# Patient Record
Sex: Female | Born: 1985 | Race: Asian | Hispanic: No | Marital: Married | Smoking: Never smoker
Health system: Southern US, Community
[De-identification: ages and names within clinical notes are randomized; demographics above are authoritative.]

## PROBLEM LIST (undated history)

## (undated) ENCOUNTER — Inpatient Hospital Stay (HOSPITAL_COMMUNITY): Payer: Self-pay

## (undated) DIAGNOSIS — Z789 Other specified health status: Secondary | ICD-10-CM

## (undated) DIAGNOSIS — J45909 Unspecified asthma, uncomplicated: Secondary | ICD-10-CM

## (undated) DIAGNOSIS — R0600 Dyspnea, unspecified: Secondary | ICD-10-CM

## (undated) HISTORY — PX: NO PAST SURGERIES: SHX2092

## (undated) HISTORY — DX: Dyspnea, unspecified: R06.00

---

## 2014-10-19 ENCOUNTER — Emergency Department (HOSPITAL_BASED_OUTPATIENT_CLINIC_OR_DEPARTMENT_OTHER)
Admission: EM | Admit: 2014-10-19 | Discharge: 2014-10-20 | Disposition: A | Discharge status: Home / Self Care | Payer: Medicaid Other | Attending: Emergency Medicine | Admitting: Emergency Medicine

## 2014-10-19 ENCOUNTER — Encounter (HOSPITAL_BASED_OUTPATIENT_CLINIC_OR_DEPARTMENT_OTHER): Payer: Self-pay | Admitting: Emergency Medicine

## 2014-10-19 DIAGNOSIS — T7840XA Allergy, unspecified, initial encounter: Secondary | ICD-10-CM | POA: Diagnosis present

## 2014-10-19 DIAGNOSIS — X58XXXA Exposure to other specified factors, initial encounter: Secondary | ICD-10-CM | POA: Insufficient documentation

## 2014-10-19 DIAGNOSIS — Y9289 Other specified places as the place of occurrence of the external cause: Secondary | ICD-10-CM | POA: Diagnosis not present

## 2014-10-19 DIAGNOSIS — Y9389 Activity, other specified: Secondary | ICD-10-CM | POA: Insufficient documentation

## 2014-10-19 DIAGNOSIS — Y998 Other external cause status: Secondary | ICD-10-CM | POA: Insufficient documentation

## 2014-10-19 MED ORDER — EPINEPHRINE 0.3 MG/0.3ML IJ SOAJ
0.3000 mg | Freq: Once | INTRAMUSCULAR | Status: AC
  Administered 2014-10-19: 0.3 mg via INTRAMUSCULAR
  Filled 2014-10-19: qty 0.3

## 2014-10-19 MED ORDER — DEXAMETHASONE SODIUM PHOSPHATE 10 MG/ML IJ SOLN
10.0000 mg | Freq: Once | INTRAMUSCULAR | Status: AC
  Administered 2014-10-19: 10 mg via INTRAVENOUS
  Filled 2014-10-19: qty 1

## 2014-10-19 MED ORDER — FAMOTIDINE IN NACL 20-0.9 MG/50ML-% IV SOLN
20.0000 mg | Freq: Once | INTRAVENOUS | Status: AC
Start: 2014-10-19 — End: 2014-10-19
  Administered 2014-10-19: 20 mg via INTRAVENOUS
  Filled 2014-10-19: qty 50

## 2014-10-19 NOTE — ED Provider Notes (Signed)
CSN: [id number]     Arrival date & time 10/19/14  2231 History   None    Chief Complaint  Patient presents with  . Allergic Reaction     (Consider location/radiation/quality/duration/timing/severity/associated sxs/prior Treatment) HPI  This is a 29 year old female who fell into some bushes about 2 hours prior to arrival; she thinks she was bitten or stung by an insect. She subsequently developed shortness of breath, generalized urticarial rash, itching and swelling of her eyes. She vomited one time. Symptoms are moderate to severe. She was given 50 milligrams of Benadryl by her husband about 30 minutes prior to arrival. He also put some Benadryl cream on her face. She is having some painful swelling of her right foot. She is also having a sore throat, worse with swallowing.  History reviewed. No pertinent past medical history. History reviewed. No pertinent past surgical history. No family history on file. Social History  Substance Use Topics  . Smoking status: Never Smoker   . Smokeless tobacco: None  . Alcohol Use: None   OB History    No data available     Review of Systems  All other systems reviewed and are negative.   Allergies  Review of patient's allergies indicates no known allergies.  Home Medications   Prior to Admission medications   Not on File   BP 102/58 mmHg  Pulse 80  Temp(Src) 98.8 F (37.1 C) (Oral)  Resp 22  Ht  (1.626 m)  Wt 120 lb (54.432 kg)  BMI 20.59 kg/m2  SpO2 97%   Physical Exam  General: Well-developed, well-nourished female in mild distress; appearance consistent with age of record HENT: normocephalic; atraumatic; angioedema of eyelids Eyes: pupils equal, round and reactive to light; extraocular muscles intact Neck: supple Heart: regular rate and rhythm Lungs: Tachypnea; decreased air movement bilaterally without wheezing Abdomen: soft; nondistended; nontender; bowel sounds present Extremities: No deformity; full range of  motion; pulses normal Neurologic: Awake, alert; motor function intact in all extremities and symmetric; no facial droop Skin: Warm and dry; generalized urticarial rash most prominent on the face and neck; erythema and swelling of the right foot Psychiatric: Anxious; tearful      ED Course  Procedures (including critical care time)   MDM  12:15 AM Patient significantly improved after medications. She is calm, breathing easily and the rash is receding. She still complaining of a sore throat. We will check for strep as she has been exposed to a family member with a sore throat.  Nursing notes and vitals signs, including pulse oximetry, reviewed.  Summary of this visit's results, reviewed by myself:  Labs:  Results for orders placed or performed during the hospital encounter of 10/19/14 (from the past 24 hour(s))  Rapid strep screen     Status: None   Collection Time: 10/20/14 12:20 AM  Result Value Ref Range   Streptococcus, Group A Screen (Direct) NEGATIVE NEGATIVE   1:32 AM Rash resolved, patient's only complaint is persistent sore throat. We will discharge her on Benadryl and provided a prescription for an epinephrine autoinjector.   Paula Libra, MD 10/20/14 [phone]

## 2014-10-19 NOTE — ED Notes (Signed)
Patient placed on cardiac monitor set for every 30 vitals

## 2014-10-19 NOTE — ED Notes (Signed)
Patient states that she fell into a bed of poision ivy and has a red raised rash throughout her face and eyes. Her eyes are swollen shut and having trouble breathing.

## 2014-10-20 LAB — RAPID STREP SCREEN (MED CTR MEBANE ONLY): STREPTOCOCCUS, GROUP A SCREEN (DIRECT): NEGATIVE

## 2014-10-20 MED ORDER — DIPHENHYDRAMINE HCL 25 MG PO TABS: ORAL_TABLET | ORAL | Status: DC

## 2014-10-20 MED ORDER — DIPHENHYDRAMINE HCL 25 MG PO CAPS
25.0000 mg | ORAL_CAPSULE | Freq: Once | ORAL | Status: AC
  Administered 2014-10-20: 25 mg via ORAL

## 2014-10-20 MED ORDER — DIPHENHYDRAMINE HCL 50 MG/ML IJ SOLN
25.0000 mg | Freq: Once | INTRAMUSCULAR | Status: DC
Start: 2014-10-20 — End: 2014-10-20

## 2014-10-20 MED ORDER — DIPHENHYDRAMINE HCL 25 MG PO CAPS
ORAL_CAPSULE | ORAL | Status: AC
  Administered 2014-10-20: 25 mg via ORAL
  Filled 2014-10-20: qty 1

## 2014-10-20 MED ORDER — EPINEPHRINE 0.3 MG/0.3ML IJ SOAJ: INTRAMUSCULAR | Status: DC

## 2014-10-20 NOTE — ED Notes (Signed)
Pt condition has much improved facial edema and generalized rash nearly resolved and pt denies SOB breathing easy. Family at bedside to support pt at discahrge

## 2014-10-22 LAB — CULTURE, GROUP A STREP: STREP A CULTURE: NEGATIVE

## 2014-11-10 ENCOUNTER — Encounter (HOSPITAL_BASED_OUTPATIENT_CLINIC_OR_DEPARTMENT_OTHER): Payer: Self-pay

## 2014-11-10 ENCOUNTER — Emergency Department (HOSPITAL_BASED_OUTPATIENT_CLINIC_OR_DEPARTMENT_OTHER): Payer: Medicaid Other

## 2014-11-10 ENCOUNTER — Emergency Department (HOSPITAL_BASED_OUTPATIENT_CLINIC_OR_DEPARTMENT_OTHER)
Admission: EM | Admit: 2014-11-10 | Discharge: 2014-11-10 | Disposition: A | Discharge status: Home / Self Care | Payer: Medicaid Other | Attending: Emergency Medicine | Admitting: Emergency Medicine

## 2014-11-10 DIAGNOSIS — R109 Unspecified abdominal pain: Secondary | ICD-10-CM

## 2014-11-10 DIAGNOSIS — N3000 Acute cystitis without hematuria: Secondary | ICD-10-CM

## 2014-11-10 DIAGNOSIS — O2311 Infections of bladder in pregnancy, first trimester: Secondary | ICD-10-CM | POA: Insufficient documentation

## 2014-11-10 DIAGNOSIS — R5383 Other fatigue: Secondary | ICD-10-CM | POA: Diagnosis not present

## 2014-11-10 DIAGNOSIS — O26899 Other specified pregnancy related conditions, unspecified trimester: Secondary | ICD-10-CM

## 2014-11-10 DIAGNOSIS — K5900 Constipation, unspecified: Secondary | ICD-10-CM | POA: Insufficient documentation

## 2014-11-10 DIAGNOSIS — O99611 Diseases of the digestive system complicating pregnancy, first trimester: Secondary | ICD-10-CM | POA: Diagnosis not present

## 2014-11-10 DIAGNOSIS — Z3490 Encounter for supervision of normal pregnancy, unspecified, unspecified trimester: Secondary | ICD-10-CM

## 2014-11-10 DIAGNOSIS — Z3A01 Less than 8 weeks gestation of pregnancy: Secondary | ICD-10-CM | POA: Diagnosis not present

## 2014-11-10 DIAGNOSIS — O9989 Other specified diseases and conditions complicating pregnancy, childbirth and the puerperium: Secondary | ICD-10-CM | POA: Diagnosis present

## 2014-11-10 LAB — CBC WITH DIFFERENTIAL/PLATELET
BASOS PCT: 0 %
Basophils Absolute: 0 10*3/uL (ref 0.0–0.1)
EOS ABS: 0.1 10*3/uL (ref 0.0–0.7)
EOS PCT: 1 %
HCT: 37 % (ref 36.0–46.0)
HEMOGLOBIN: 13 g/dL (ref 12.0–15.0)
Lymphocytes Relative: 29 %
Lymphs Abs: 1.7 10*3/uL (ref 0.7–4.0)
MCH: 28.5 pg (ref 26.0–34.0)
MCHC: 35.1 g/dL (ref 30.0–36.0)
MCV: 81.1 fL (ref 78.0–100.0)
MONOS PCT: 8 %
Monocytes Absolute: 0.5 10*3/uL (ref 0.1–1.0)
NEUTROS PCT: 62 %
Neutro Abs: 3.7 10*3/uL (ref 1.7–7.7)
PLATELETS: 214 10*3/uL (ref 150–400)
RBC: 4.56 MIL/uL (ref 3.87–5.11)
RDW: 12.9 % (ref 11.5–15.5)
WBC: 5.9 10*3/uL (ref 4.0–10.5)

## 2014-11-10 LAB — URINALYSIS, ROUTINE W REFLEX MICROSCOPIC
Glucose, UA: NEGATIVE mg/dL
KETONES UR: 15 mg/dL — AB
NITRITE: NEGATIVE
PH: 6 (ref 5.0–8.0)
PROTEIN: NEGATIVE mg/dL
Specific Gravity, Urine: 1.03 (ref 1.005–1.030)
UROBILINOGEN UA: 1 mg/dL (ref 0.0–1.0)

## 2014-11-10 LAB — HCG, QUANTITATIVE, PREGNANCY: HCG, BETA CHAIN, QUANT, S: 135529 m[IU]/mL — AB (ref <5)

## 2014-11-10 LAB — BASIC METABOLIC PANEL
Anion gap: 3 — ABNORMAL LOW (ref 5–15)
BUN: 12 mg/dL (ref 6–20)
CHLORIDE: 107 mmol/L (ref 101–111)
CO2: 24 mmol/L (ref 22–32)
CREATININE: 0.58 mg/dL (ref 0.44–1.00)
Calcium: 8.7 mg/dL — ABNORMAL LOW (ref 8.9–10.3)
Glucose, Bld: 101 mg/dL — ABNORMAL HIGH (ref 65–99)
Potassium: 4.1 mmol/L (ref 3.5–5.1)
SODIUM: 134 mmol/L — AB (ref 135–145)

## 2014-11-10 LAB — URINE MICROSCOPIC-ADD ON

## 2014-11-10 LAB — PREGNANCY, URINE: Preg Test, Ur: POSITIVE — AB

## 2014-11-10 MED ORDER — PRENATAL COMPLETE 14-0.4 MG PO TABS: 1.0000 | ORAL_TABLET | Freq: Every day | ORAL | Status: DC

## 2014-11-10 MED ORDER — CEPHALEXIN 500 MG PO CAPS: 500.0000 mg | ORAL_CAPSULE | Freq: Four times a day (QID) | ORAL | Status: DC

## 2014-11-10 NOTE — ED Notes (Signed)
abd pain x 1 week-back pain x 2 days per husband-pt does not speak English-[place]rived [place] 2 [place]

## 2014-11-10 NOTE — ED Provider Notes (Signed)
CSN: [id number]     Arrival date & time 11/10/14  1355 History   First MD Initiated Contact with Patient 11/10/14 1409     Chief Complaint  Patient presents with  . Abdominal Pain    (Consider location/radiation/quality/duration/timing/severity/associated sxs/prior Treatment) HPI Comments: Patient with no significant past medical history presents with complaint of lower abdominal pain for the past 1-2 weeks, lower back pain over the past 2 days. She has also had fatigue. Associated with constipation but no diarrhea. No blood in stool. No urinary frequency, urgency. She normally has her menstrual period twice a month. Her last menstrual period was 1-1/2 months ago. Husband feels that she could be pregnant. Patient has had nausea but no vomiting. Patient has had chest pain and some associated shortness of breath, mild, intermittently for a very long time (months to years) and this is unchanged. No treatments prior to arrival.  The history is provided by the patient and the spouse. The history is limited by a language barrier. A language interpreter was used (husband).    History reviewed. No pertinent past medical history. History reviewed. No pertinent past surgical history. No family history on file. Social History  Substance Use Topics  . Smoking status: Never Smoker   . Smokeless tobacco: None  . Alcohol Use: No   OB History    No data available     Review of Systems  Constitutional: Positive for fatigue. Negative for fever.  HENT: Negative for rhinorrhea and sore throat.   Eyes: Negative for redness.  Respiratory: Negative for cough.   Cardiovascular: Negative for chest pain.  Gastrointestinal: Positive for nausea and abdominal pain. Negative for vomiting and diarrhea.  Genitourinary: Negative for dysuria, urgency, frequency, vaginal bleeding and vaginal discharge.  Musculoskeletal: Positive for back pain. Negative for myalgias.  Skin: Negative for rash.  Neurological:  Negative for headaches.      Allergies  Review of patient's allergies indicates no known allergies.  Home Medications   Prior to Admission medications   Medication Sig Start Date End Date Taking? Authorizing Provider  diphenhydrAMINE (BENADRYL) 25 MG tablet Take 2 tablets every 6 hours for the next 3 days then every 6 hours as needed for allergic reaction. 10/20/14   John Molpus, MD  EPINEPHrine 0.3 mg/0.3 mL IJ SOAJ injection Self inject per package instructions as needed for allergic reaction and seek medical care.  Pharmacist: please dispense authorized generic 10/20/14   Paula LibraJohn Molpus, MD   BP 107/65 mmHg  Pulse 78  Temp(Src) 98.7 F (37.1 C) (Oral)  Resp 16  Ht 5\' 3"  (1.6 m)  Wt 127 lb (57.607 kg)  BMI 22.50 kg/m2  SpO2 98% Physical Exam  Constitutional: She appears well-developed and well-nourished.  HENT:  Head: Normocephalic and atraumatic.  Mouth/Throat: Oropharynx is clear and moist.  Eyes: Conjunctivae are normal. Right eye exhibits no discharge. Left eye exhibits no discharge.  Neck: Normal range of motion. Neck supple.  Cardiovascular: Normal rate, regular rhythm and normal heart sounds.   No murmur heard. Pulmonary/Chest: Effort normal and breath sounds normal. No respiratory distress. She has no wheezes. She has no rales.  Abdominal: Soft. Bowel sounds are normal. She exhibits no distension. There is tenderness (mild, lower abdomen). There is no rebound and no guarding.  Non-gravid  Musculoskeletal: Normal range of motion. She exhibits no edema or tenderness.  Neurological: She is alert.  Skin: Skin is warm and dry.  Psychiatric: She has a normal mood and affect.  Nursing note and  vitals reviewed.   ED Course  Procedures (including critical care time) Labs Review Labs Reviewed  URINALYSIS, ROUTINE W REFLEX MICROSCOPIC (NOT AT [facility]) - Abnormal; Notable for the following:    Color, Urine AMBER (*)    APPearance CLOUDY (*)    Hgb urine dipstick SMALL (*)     Bilirubin Urine SMALL (*)    Ketones, ur 15 (*)    Leukocytes, UA MODERATE (*)    All other components within normal limits  PREGNANCY, URINE - Abnormal; Notable for the following:    Preg Test, Ur POSITIVE (*)    All other components within normal limits  BASIC METABOLIC PANEL - Abnormal; Notable for the following:    Sodium 134 (*)    Glucose, Bld 101 (*)    Calcium 8.7 (*)    Anion gap 3 (*)    All other components within normal limits  HCG, QUANTITATIVE, PREGNANCY - Abnormal; Notable for the following:    hCG, Beta Chain, Quant, S [id number] (*)    All other components within normal limits  URINE MICROSCOPIC-ADD ON - Abnormal; Notable for the following:    Squamous Epithelial / LPF FEW (*)    Bacteria, UA MANY (*)    All other components within normal limits  CBC WITH DIFFERENTIAL/PLATELET    Imaging Review US Ob Comp Less 14 Wks  11/10/2014  CLINICAL DATA:  Two day history of left-sided pelvic pain EXAM: OBSTETRIC <14 WK [place] AND TRANSVAGINAL OB US TECHNIQUE: Both transabdominal and transvaginal ultrasound examinations were performed for complete evaluation of the gestation as well as the maternal uterus, adnexal regions, and pelvic cul-de-sac. Transvaginal technique was performed to assess early pregnancy. COMPARISON:  None. FINDINGS: Intrauterine gestational sac: Visualized/normal in shape. Yolk sac:  Visualized Embryo:  Visualized Cardiac Activity: Visualized Heart Rate: 143  bpm CRL:  10  mm 7  w   1 d                  [place] EDC: June 27, 2015 Maternal uterus/adnexae: There is no demonstrable subchorionic hemorrhage. Cervical os is closed. There are small paraovarian cysts on the left, largest measuring 1.2 x 0.9 cm. No free pelvic fluid. IMPRESSION: Single live intrauterine gestation with estimated gestational age of [redacted] weeks. No subchorionic hemorrhage appreciable. Small maternal left paraovarian cysts. Electronically Signed   By: Bretta Bang III M.D.   On: 11/10/2014 16:28   US  Ob Transvaginal  11/10/2014  CLINICAL DATA:  Two day history of left-sided pelvic pain EXAM: OBSTETRIC <14 WK [place] AND TRANSVAGINAL OB US TECHNIQUE: Both transabdominal and transvaginal ultrasound examinations were performed for complete evaluation of the gestation as well as the maternal uterus, adnexal regions, and pelvic cul-de-sac. Transvaginal technique was performed to assess early pregnancy. COMPARISON:  None. FINDINGS: Intrauterine gestational sac: Visualized/normal in shape. Yolk sac:  Visualized Embryo:  Visualized Cardiac Activity: Visualized Heart Rate: 143  bpm CRL:  10  mm 7  w   1 d                  [place] EDC: June 27, 2015 Maternal uterus/adnexae: There is no demonstrable subchorionic hemorrhage. Cervical os is closed. There are small paraovarian cysts on the left, largest measuring 1.2 x 0.9 cm. No free pelvic fluid. IMPRESSION: Single live intrauterine gestation with estimated gestational age of [redacted] weeks. No subchorionic hemorrhage appreciable. Small maternal left paraovarian cysts. Electronically Signed   By: Bretta Bang III M.D.   On: 11/10/2014  16:28   I have personally reviewed and evaluated these images and lab results as part of my medical decision-making.   EKG Interpretation None       3:01 PM Patient seen and examined. Updated on results including + pregnancy. Will proceed with [place] to r/o tubal pregnancy.   Vital signs reviewed and are as follows: BP 107/65 mmHg  Pulse 78  Temp(Src) 98.7 F (37.1 C) (Oral)  Resp 16  Ht  (1.6 m)  Wt 127 lb (57.607 kg)  BMI 22.50 kg/m2  SpO2 98%  5:39 PM ultrasound completed showing intrauterine pregnancy at approximately 7 weeks 1 day gestation. Patient and husband updated on results including UA positive for probable urinary tract infection. Patient appears well and is comfortable in the room. Referral given to hospital for follow-up. Prescription given for prenatal vitamin. Husband will work on obtaining Medicaid and prenatal  care tomorrow. Encouraged follow-up to the emergency department with fever, worsening abdominal pain, vaginal bleeding, or other concerns. They verbalized understanding and agreed with plan.  MDM   Final diagnoses:  Pregnancy  Acute cystitis without hematuria   Pregnancy: Confirmed IUP. Referral to women's hospital given.  Lower abdominal pain with radiation to the back: Likely related to pregnancy plus likely urinary tract infection (11-20 WBCs, many bacteria). Patient treated with Keflex. She appears well, nontoxic. No fever. Do not suspect pyelonephritis. Abdomen is otherwise soft and nontender and I doubt any other emergent abdominal etiology. With blood cell count is normal.  No dangerous or life-threatening conditions suspected or identified by history, physical exam, and by work-up. No indications for hospitalization identified.     Renne Crigler, PA-C 11/10/14 1741  Doug Sou, MD 11/11/14 [phone]

## 2014-11-10 NOTE — Discharge Instructions (Signed)
Please read and follow all provided instructions.  Your diagnoses today include:  1. Abdominal pain in pregnancy   2. Pregnancy   3. Acute cystitis without hematuria     Tests performed today include:  Blood counts and electrolytes  Blood tests to check kidney function  Urine test to look for infection and pregnancy (in women) - shows pregnancy and urinary tract infection  Ultrasound - shows pregnancy in the uterus at approximate 7 weeks and 1 day gestation  Vital signs. See below for your results today.   Medications prescribed:   Keflex (cephalexin) - antibiotic  You have been prescribed an antibiotic medicine: take the entire course of medicine even if you are feeling better. Stopping early can cause the antibiotic not to work.  Take any prescribed medications only as directed.  Home care instructions:   Follow any educational materials contained in this packet.  Follow-up instructions: Please follow-up with your primary care provider in the next 7 days for further evaluation of your symptoms.    Return instructions:  SEEK IMMEDIATE MEDICAL ATTENTION IF:  The pain does not go away or becomes severe   A temperature above 101F develops   Repeated vomiting occurs (multiple episodes)   The pain becomes localized to portions of the abdomen. The right side could possibly be appendicitis. In an adult, the left lower portion of the abdomen could be colitis or diverticulitis.   Blood is being passed in stools or vomit (bright red or black tarry stools)   You develop chest pain, difficulty breathing, dizziness or fainting, or become confused, poorly responsive, or inconsolable (young children)  If you have any other emergent concerns regarding your health  Additional Information: Abdominal (belly) pain can be caused by many things. Your caregiver performed an examination and possibly ordered blood/urine tests and imaging (CT scan, x-rays, ultrasound). Many cases can be  observed and treated at home after initial evaluation in the emergency department. Even though you are being discharged home, abdominal pain can be unpredictable. Therefore, you need a repeated exam if your pain does not resolve, returns, or worsens. Most patients with abdominal pain don't have to be admitted to the hospital or have surgery, but serious problems like appendicitis and gallbladder attacks can start out as nonspecific pain. Many abdominal conditions cannot be diagnosed in one visit, so follow-up evaluations are very important.  Your vital signs today were: BP 107/65 mmHg   Pulse 78   Temp(Src) 98.7 F (37.1 C) (Oral)   Resp 16   Ht 5\' 3"  (1.6 m)   Wt 127 lb (57.607 kg)   BMI 22.50 kg/m2   SpO2 98% If your blood pressure (bp) was elevated above 135/85 this visit, please have this repeated by your doctor within one month. --------------

## 2014-11-24 ENCOUNTER — Other Ambulatory Visit (HOSPITAL_COMMUNITY)
Admission: RE | Admit: 2014-11-24 | Discharge: 2014-11-24 | Disposition: A | Discharge status: Home / Self Care | Payer: Medicaid Other | Source: Ambulatory Visit | Attending: Student | Admitting: Student

## 2014-11-24 ENCOUNTER — Encounter: Payer: Self-pay | Admitting: Student

## 2014-11-24 ENCOUNTER — Ambulatory Visit (INDEPENDENT_AMBULATORY_CARE_PROVIDER_SITE_OTHER): Payer: Medicaid Other | Admitting: Student

## 2014-11-24 VITALS — BP 116/68 | HR 95 | Temp 98.1°F | Wt 130.0 lb

## 2014-11-24 DIAGNOSIS — Z1151 Encounter for screening for human papillomavirus (HPV): Secondary | ICD-10-CM | POA: Insufficient documentation

## 2014-11-24 DIAGNOSIS — Z23 Encounter for immunization: Secondary | ICD-10-CM | POA: Diagnosis not present

## 2014-11-24 DIAGNOSIS — Z3401 Encounter for supervision of normal first pregnancy, first trimester: Secondary | ICD-10-CM

## 2014-11-24 DIAGNOSIS — Z113 Encounter for screening for infections with a predominantly sexual mode of transmission: Secondary | ICD-10-CM | POA: Diagnosis not present

## 2014-11-24 DIAGNOSIS — O0990 Supervision of high risk pregnancy, unspecified, unspecified trimester: Secondary | ICD-10-CM | POA: Insufficient documentation

## 2014-11-24 DIAGNOSIS — Z124 Encounter for screening for malignant neoplasm of cervix: Secondary | ICD-10-CM | POA: Diagnosis not present

## 2014-11-24 DIAGNOSIS — Z3491 Encounter for supervision of normal pregnancy, unspecified, first trimester: Secondary | ICD-10-CM

## 2014-11-24 LAB — POCT URINALYSIS DIP (DEVICE)
Bilirubin Urine: NEGATIVE
Glucose, UA: NEGATIVE mg/dL
Ketones, ur: NEGATIVE mg/dL
Nitrite: NEGATIVE
PH: 6 (ref 5.0–8.0)
Protein, ur: NEGATIVE mg/dL
SPECIFIC GRAVITY, URINE: 1.025 (ref 1.005–1.030)
UROBILINOGEN UA: 0.2 mg/dL (ref 0.0–1.0)

## 2014-11-24 NOTE — Progress Notes (Signed)
Here for initial visit. Used [employer]. Given prenatal education booklets. C/o pelvic cramping.

## 2014-11-24 NOTE — Patient Instructions (Signed)
First Trimester of Pregnancy The first trimester of pregnancy is from week 1 until the end of week 12 (months 1 through 3). During this time, your baby will begin to develop inside you. At 6-8 weeks, the eyes and face are formed, and the heartbeat can be seen on ultrasound. At the end of 12 weeks, all the baby's organs are formed. Prenatal care is all the medical care you receive before the birth of your baby. Make sure you get good prenatal care and follow all of your doctor's instructions. HOME CARE  Medicines  Take medicine only as told by your doctor. Some medicines are safe and some are not during pregnancy.  Take your prenatal vitamins as told by your doctor.  Take medicine that helps you poop (stool softener) as needed if your doctor says it is okay. Diet  Eat regular, healthy meals.  Your doctor will tell you the amount of weight gain that is right for you.  Avoid raw meat and uncooked cheese.  If you feel sick to your stomach (nauseous) or throw up (vomit):  Eat 4 or 5 small meals a day instead of 3 large meals.  Try eating a few soda crackers.  Drink liquids between meals instead of during meals.  If you have a hard time pooping (constipation):  Eat high-fiber foods like fresh vegetables, fruit, and whole grains.  Drink enough fluids to keep your pee (urine) clear or pale yellow. Activity and Exercise  Exercise only as told by your doctor. Stop exercising if you have cramps or pain in your lower belly (abdomen) or low back.  Try to avoid standing for long periods of time. Move your legs often if you must stand in one place for a long time.  Avoid heavy lifting.  Wear low-heeled shoes. Sit and stand up straight.  You can have sex unless your doctor tells you not to. Relief of Pain or Discomfort  Wear a good support bra if your breasts are sore.  Take warm water baths (sitz baths) to soothe pain or discomfort caused by hemorrhoids. Use hemorrhoid cream if your  doctor says it is okay.  Rest with your legs raised if you have leg cramps or low back pain.  Wear support hose if you have puffy, bulging veins (varicose veins) in your legs. Raise (elevate) your feet for 15 minutes, 3-4 times a day. Limit salt in your diet. Prenatal Care  Schedule your prenatal visits by the twelfth week of pregnancy.  Write down your questions. Take them to your prenatal visits.  Keep all your prenatal visits as told by your doctor. Safety  Wear your seat belt at all times when driving.  Make a list of emergency phone numbers. The list should include numbers for family, friends, the hospital, and police and fire departments. General Tips  Ask your doctor for a referral to a local prenatal class. Begin classes no later than at the start of month 6 of your pregnancy.  Ask for help if you need counseling or help with nutrition. Your doctor can give you advice or tell you where to go for help.  Do not use hot tubs, steam rooms, or saunas.  Do not douche or use tampons or scented sanitary pads.  Do not cross your legs for long periods of time.  Avoid litter boxes and soil used by cats.  Avoid all smoking, herbs, and alcohol. Avoid drugs not approved by your doctor.  Do not use any tobacco products, including cigarettes,  chewing tobacco, and electronic cigarettes. If you need help quitting, ask your doctor. You may get counseling or other support to help you quit. °· Visit your dentist. At home, brush your teeth with a soft toothbrush. Be gentle when you floss. °GET HELP IF: °· You are dizzy. °· You have mild cramps or pressure in your lower belly. °· You have a nagging pain in your belly area. °· You continue to feel sick to your stomach, throw up, or have watery poop (diarrhea). °· You have a bad smelling fluid coming from your vagina. °· You have pain with peeing (urination). °· You have increased puffiness (swelling) in your face, hands, legs, or ankles. °GET HELP  RIGHT AWAY IF:  °· You have a fever. °· You are leaking fluid from your vagina. °· You have spotting or bleeding from your vagina. °· You have very bad belly cramping or pain. °· You gain or lose weight rapidly. °· You throw up blood. It may look like coffee grounds. °· You are around people who have German measles, fifth disease, or chickenpox. °· You have a very bad headache. °· You have shortness of breath. °· You have any kind of trauma, such as from a fall or a car accident. °  °This information is not intended to replace advice given to you by your health care provider. Make sure you discuss any questions you have with your health care provider. °  °Document Released: 06/20/2007 Document Revised: 01/22/2014 Document Reviewed: 11/11/2012 °Elsevier Interactive Patient Education ©2016 [employer] ° ° ° °Safe Medications in Pregnancy  ° °Acne: °Benzoyl Peroxide °Salicylic Acid ° °Backache/Headache: °Tylenol: 2 regular strength every 4 hours OR °             2 Extra strength every 6 hours ° °Colds/Coughs/Allergies: °Benadryl (alcohol free) 25 mg every 6 hours as needed °Breath right strips °Claritin °Cepacol throat lozenges °Chloraseptic throat spray °Cold-Eeze- up to three times per day °Cough drops, alcohol free °Flonase (by prescription only) °Guaifenesin °Mucinex °Robitussin DM (plain only, alcohol free) °Saline nasal spray/drops °Sudafed (pseudoephedrine) & Actifed ** use only after [redacted] weeks gestation and if you do not have high blood pressure °Tylenol °Vicks Vaporub °Zinc lozenges °Zyrtec  ° °Constipation: °Colace °Ducolax suppositories °Fleet enema °Glycerin suppositories °Metamucil °Milk of magnesia °Miralax °Senokot °Smooth move tea ° °Diarrhea: °Kaopectate °Imodium A-D ° °*NO pepto Bismol ° °Hemorrhoids: °Anusol °Anusol HC °Preparation H °Tucks ° °Indigestion: °Tums °Maalox °Mylanta °Zantac  °Pepcid ° °Insomnia: °Benadryl (alcohol free) 25mg every 6 hours as needed °Tylenol PM °Unisom, no Gelcaps ° °Leg  Cramps: °Tums °MagGel ° °Nausea/Vomiting:  °Bonine °Dramamine °Emetrol °Ginger extract °Sea bands °Meclizine  °Nausea medication to take during pregnancy:  °Unisom (doxylamine succinate 25 mg tablets) Take one tablet daily at bedtime. If symptoms are not adequately controlled, the dose can be increased to a maximum recommended dose of two tablets daily (1/2 tablet in the morning, 1/2 tablet mid-afternoon and one at bedtime). °Vitamin B6 100mg tablets. Take one tablet twice a day (up to 200 mg per day). ° °Skin Rashes: °Aveeno products °Benadryl cream or 25mg every 6 hours as needed °Calamine Lotion °1% cortisone cream ° °Yeast infection: °Gyne-lotrimin 7 °Monistat 7 ° ° °**If taking multiple medications, please check labels to avoid duplicating the same active ingredients °**take medication as directed on the label °** Do not exceed 4000 mg of tylenol in 24 hours °**Do not take medications that contain aspirin or ibuprofen ° ° ° ° °

## 2014-11-24 NOTE — Progress Notes (Signed)
  Subjective:    Misty Allen is being seen today for her first obstetrical visit.  This is a planned pregnancy. She is at 6223w1d gestation. Her obstetrical history is significant for first pregnancy. . Relationship with FOB: spouse, living together. Patient undecided intend to breast feed. Pregnancy history fully reviewed.  Patient reports no bleeding, no contractions and no cramping.  Review of Systems:   Review of Systems  Constitutional: No fever, chills, fatigue Respiratory: Denies cough or wheezing. Reports SOB at night while lying down.  Cardiovascular: Denies chest pain or palpitations Gastrointestinal: Denies abdominal pain, n/v/d, constipation Genitourinary: Denies vaginal bleeding, vaginal irritation. Reports increase in vaginal discharge.   Objective:     BP 116/68 mmHg  Pulse 95  Temp(Src) 98.1 F (36.7 C)  Wt 130 lb (58.968 kg) Physical Exam  Exam Constitutional: Well developed & well nourished female Cardiovascular: RRR, no murmur Pulmonary: clear breath sound bilaterally, no wheezing or respiratory distress Abdomen: abdomen soft & non tender, bowel sounds x 4 Pelvic: small amount of white mucoid discharge. No blood. No CMT. Cervix closed.  Neuro: alert & oriented   Assessment:    Pregnancy: G1P0000 Patient Active Problem List   Diagnosis Date Noted  . Supervision of normal first pregnancy 11/24/2014       Plan:  1. Supervision of normal pregnancy in first trimester  - Prenatal Profile - Prescript Monitor Profile(19) - Culture, OB Urine - GC/Chlamydia probe amp ([place])not at [facility] - Cytology - PAP - Flu Vaccine QUAD 36+ mos IM; Standing - Glucose Tolerance, 1 HR (50g) w/o Fasting - Flu Vaccine QUAD 36+ mos IM - US MFM OB COMP + 14 WK; Future  2. Encounter for supervision of normal first pregnancy in first trimester  - Wet prep, genital    Initial labs drawn. Prenatal vitamins. Problem list reviewed and updated. AFP3 discussed:  declined. Role of ultrasound in pregnancy discussed; fetal survey: ordered. Amniocentesis discussed: declined. Follow up in 4 weeks.    Misty Allen 11/24/2014

## 2014-11-24 NOTE — Progress Notes (Signed)
Patient complains of SOB and tiredness.

## 2014-11-25 LAB — PRESCRIPTION MONITORING PROFILE (19 PANEL)
AMPHETAMINE/METH: NEGATIVE ng/mL
BENZODIAZEPINE SCREEN, URINE: NEGATIVE ng/mL
Barbiturate Screen, Urine: NEGATIVE ng/mL
Buprenorphine, Urine: NEGATIVE ng/mL
Cannabinoid Scrn, Ur: NEGATIVE ng/mL
Carisoprodol, Urine: NEGATIVE ng/mL
Cocaine Metabolites: NEGATIVE ng/mL
Creatinine, Urine: 222.35 mg/dL (ref >20.0)
ECSTASY: NEGATIVE ng/mL
FENTANYL URINE: NEGATIVE ng/mL
Meperidine, Ur: NEGATIVE ng/mL
Methadone Screen, Urine: NEGATIVE ng/mL
Methaqualone: NEGATIVE ng/mL
NITRITES URINE, INITIAL: NEGATIVE ug/mL
OPIATE SCREEN, URINE: NEGATIVE ng/mL
Oxycodone Screen, Ur: NEGATIVE ng/mL
PROPOXYPHENE: NEGATIVE ng/mL
Phencyclidine, Ur: NEGATIVE ng/mL
TAPENTADOLUR: NEGATIVE ng/mL
TRAMADOL UR: NEGATIVE ng/mL
ZOLPIDEM, URINE: NEGATIVE ng/mL
pH, Initial: 5.5 pH (ref 4.5–8.9)

## 2014-11-25 LAB — PRENATAL PROFILE (SOLSTAS)
Antibody Screen: NEGATIVE
Basophils Absolute: 0 10*3/uL (ref 0.0–0.1)
Basophils Relative: 0 % (ref 0–1)
EOS ABS: 0.1 10*3/uL (ref 0.0–0.7)
Eosinophils Relative: 1 % (ref 0–5)
HCT: 39.4 % (ref 36.0–46.0)
HEP B S AG: NEGATIVE
HIV 1&2 Ab, 4th Generation: NONREACTIVE
Hemoglobin: 13.5 g/dL (ref 12.0–15.0)
LYMPHS ABS: 1.7 10*3/uL (ref 0.7–4.0)
Lymphocytes Relative: 26 % (ref 12–46)
MCH: 28.5 pg (ref 26.0–34.0)
MCHC: 34.3 g/dL (ref 30.0–36.0)
MCV: 83.1 fL (ref 78.0–100.0)
MONO ABS: 0.5 10*3/uL (ref 0.1–1.0)
MONOS PCT: 7 % (ref 3–12)
MPV: 10.9 fL (ref 8.6–12.4)
NEUTROS ABS: 4.4 10*3/uL (ref 1.7–7.7)
NEUTROS PCT: 66 % (ref 43–77)
PLATELETS: 235 10*3/uL (ref 150–400)
RBC: 4.74 MIL/uL (ref 3.87–5.11)
RDW: 13.7 % (ref 11.5–15.5)
RUBELLA: 7.35 {index} — AB (ref <0.90)
Rh Type: POSITIVE
WBC: 6.7 10*3/uL (ref 4.0–10.5)

## 2014-11-25 LAB — GC/CHLAMYDIA PROBE AMP (~~LOC~~) NOT AT ARMC
Chlamydia: NEGATIVE
NEISSERIA GONORRHEA: NEGATIVE

## 2014-11-25 LAB — CULTURE, OB URINE
COLONY COUNT: NO GROWTH
Organism ID, Bacteria: NO GROWTH

## 2014-11-25 LAB — WET PREP, GENITAL
CLUE CELLS WET PREP: NONE SEEN
Trich, Wet Prep: NONE SEEN
YEAST WET PREP: NONE SEEN

## 2014-11-25 LAB — CYTOLOGY - PAP

## 2014-11-25 LAB — GLUCOSE TOLERANCE, 1 HOUR (50G) W/O FASTING: GLUCOSE 1 HOUR GTT: 55 mg/dL — AB (ref 70–140)

## 2015-01-05 ENCOUNTER — Ambulatory Visit (INDEPENDENT_AMBULATORY_CARE_PROVIDER_SITE_OTHER): Payer: Medicaid Other | Admitting: Obstetrics and Gynecology

## 2015-01-05 ENCOUNTER — Encounter: Payer: Self-pay | Admitting: Obstetrics and Gynecology

## 2015-01-05 VITALS — BP 106/63 | HR 74 | Temp 97.6°F | Wt 137.6 lb

## 2015-01-05 DIAGNOSIS — Z3402 Encounter for supervision of normal first pregnancy, second trimester: Secondary | ICD-10-CM

## 2015-01-05 LAB — POCT URINALYSIS DIP (DEVICE)
Bilirubin Urine: NEGATIVE
GLUCOSE, UA: NEGATIVE mg/dL
KETONES UR: NEGATIVE mg/dL
Nitrite: NEGATIVE
PROTEIN: NEGATIVE mg/dL
Specific Gravity, Urine: 1.025 (ref 1.005–1.030)
UROBILINOGEN UA: 0.2 mg/dL (ref 0.0–1.0)
pH: 5.5 (ref 5.0–8.0)

## 2015-01-05 MED ORDER — PRENATAL COMPLETE 14-0.4 MG PO TABS: 1.0000 | ORAL_TABLET | Freq: Every day | ORAL | Status: DC

## 2015-01-05 NOTE — Progress Notes (Signed)
Subjective:  Shanley Ah Ainsley SpinnerBaniowda is a 29 y.o. G1P0000 at 8383w1d being seen today for ongoing prenatal care.  She is currently monitored for the following issues for this low-risk pregnancy and has Supervision of normal first pregnancy on her problem list.  Patient reports no complaints.  Contractions: Not present. Vag. Bleeding: None.  Movement: Absent. Denies leaking of fluid.   The following portions of the patient's history were reviewed and updated as appropriate: allergies, current medications, past family history, past medical history, past social history, past surgical history and problem list. Problem list updated.  Objective:   Filed Vitals:   01/05/15 1542  BP: 106/63  Pulse: 74  Temp: 97.6 F (36.4 C)  Weight: 137 lb 9.6 oz (62.415 kg)    Fetal Status: Fetal Heart Rate (bpm): 145   Movement: Absent     General:  Alert, oriented and cooperative. Patient is in no acute distress.  Skin: Skin is warm and dry. No rash noted.   Cardiovascular: Normal heart rate noted  Respiratory: Normal respiratory effort, no problems with respiration noted  Abdomen: Soft, gravid, appropriate for gestational age. Pain/Pressure: Absent     Pelvic: Vag. Bleeding: None     Cervical exam deferred        Extremities: Normal range of motion.  Edema: None  Mental Status: Normal mood and affect. Normal behavior. Normal judgment and thought content.   Urinalysis: Urine Protein: Negative Urine Glucose: Negative  Assessment and Plan:  Pregnancy: G1P0000 at 2183w1d  1. Encounter for supervision of normal first pregnancy in second trimester Patient is doing well Anatomy ultrasound scheduled Refill on prenatal vitamins provided Patient will return on 1/18 for quad screen  General obstetric precautions including but not limited to vaginal bleeding, contractions, leaking of fluid and fetal movement were reviewed in detail with the patient. Please refer to After Visit Summary for other counseling  recommendations.  Return in about 4 weeks (around 02/02/2015).   Catalina AntiguaPeggy Kore Madlock, MD

## 2015-01-16 NOTE — L&D Delivery Note (Signed)
Over the hour prior to delivery the fetus had a fetal bradycardic episode which resolved with position oxygen I performed AROM clear fluid and cervix was 4/90/-1 FSE placed  The baby continued to have variables and I then placed a IUPC and started an amnioinfusion and instructed to give nitrous for pain management  However pt progressed very quickly and was found to be C/P +5 station    Operative Delivery Note  I placed the vacuum extractor to expedite delivery, placed at +5  At  1924 a viable female was delivered via .  Presentation: vertex; Position: Occiput,, Anterior; Station: +5.  Verbal consent: obtained from family.  Risks and benefits discussed in detail.  Risks include, but are not limited to the risks of anesthesia, bleeding, infection, damage to maternal tissues, fetal cephalhematoma.  There is also the risk of inability to effect vaginal delivery of the head, or shoulder dystocia that cannot be resolved by established maneuvers, leading to the need for emergency cesarean section.  APGAR: 8,9 ; weight 5lb 2oz  .   Placenta status:intact , .   Cord:  with the following complications: .  Cord pH: 7.34  Anesthesia:   Instruments: Mighty vac Bell Episiotomy:none   Lacerations:right periurethral   Suture Repair: none Est. Blood Loss (mL):200    Mom to postpartum.  Baby to Couplet care / Skin to Skin.  Jonatan Wilsey H 06/17/2015, 7:45 PM

## 2015-02-02 ENCOUNTER — Ambulatory Visit (HOSPITAL_COMMUNITY)
Admission: RE | Admit: 2015-02-02 | Discharge: 2015-02-02 | Disposition: A | Discharge status: Home / Self Care | Payer: Medicaid Other | Source: Ambulatory Visit | Attending: Student | Admitting: Student

## 2015-02-02 ENCOUNTER — Other Ambulatory Visit: Payer: Medicaid Other

## 2015-02-02 ENCOUNTER — Other Ambulatory Visit: Payer: Self-pay | Admitting: Student

## 2015-02-02 ENCOUNTER — Ambulatory Visit (INDEPENDENT_AMBULATORY_CARE_PROVIDER_SITE_OTHER): Payer: Medicaid Other | Admitting: Advanced Practice Midwife

## 2015-02-02 VITALS — BP 108/64 | HR 71 | Temp 98.8°F | Wt 139.1 lb

## 2015-02-02 DIAGNOSIS — O289 Unspecified abnormal findings on antenatal screening of mother: Secondary | ICD-10-CM | POA: Insufficient documentation

## 2015-02-02 DIAGNOSIS — O283 Abnormal ultrasonic finding on antenatal screening of mother: Secondary | ICD-10-CM

## 2015-02-02 DIAGNOSIS — Z3402 Encounter for supervision of normal first pregnancy, second trimester: Secondary | ICD-10-CM | POA: Diagnosis present

## 2015-02-02 DIAGNOSIS — Z3689 Encounter for other specified antenatal screening: Secondary | ICD-10-CM

## 2015-02-02 DIAGNOSIS — Z3491 Encounter for supervision of normal pregnancy, unspecified, first trimester: Secondary | ICD-10-CM

## 2015-02-02 DIAGNOSIS — Z3A19 19 weeks gestation of pregnancy: Secondary | ICD-10-CM

## 2015-02-02 LAB — POCT URINALYSIS DIP (DEVICE)
Bilirubin Urine: NEGATIVE
GLUCOSE, UA: NEGATIVE mg/dL
Ketones, ur: NEGATIVE mg/dL
Leukocytes, UA: NEGATIVE
Nitrite: NEGATIVE
PH: 6 (ref 5.0–8.0)
PROTEIN: NEGATIVE mg/dL
SPECIFIC GRAVITY, URINE: 1.01 (ref 1.005–1.030)
UROBILINOGEN UA: 0.2 mg/dL (ref 0.0–1.0)

## 2015-02-02 NOTE — Progress Notes (Signed)
Breastfeeding tip of the week reviewed. 

## 2015-02-02 NOTE — Progress Notes (Signed)
Subjective:  Misty Allen is a 30 y.o. G1P0000 at [redacted]w[redacted]d being seen today for ongoing prenatal care.  She is currently monitored for the following issues for this low-risk pregnancy and has Supervision of normal first pregnancy on her problem list.  Patient reports no complaints.  Contractions: Not present. Vag. Bleeding: None.  Movement: Absent. Denies leaking of fluid.   The following portions of the patient's history were reviewed and updated as appropriate: allergies, current medications, past family history, past medical history, past social history, past surgical history and problem list. Problem list updated.  Had anatomy US today and was told the baby might only have one kidney. Repeat US already scheduled for February.  Objective:   Filed Vitals:   02/02/15 1605  BP: 108/64  Pulse: 71  Temp: 98.8 F (37.1 C)  Weight: 139 lb 1.6 oz (63.095 kg)    Fetal Status: Fetal Heart Rate (bpm): 148   Movement: Absent     General:  Alert, oriented and cooperative. Patient is in no acute distress.  Skin: Skin is warm and dry. No rash noted.   Cardiovascular: Normal heart rate noted  Respiratory: Normal respiratory effort, no problems with respiration noted  Abdomen: Soft, gravid, appropriate for gestational age. Pain/Pressure: Present     Pelvic: Vag. Bleeding: None     Cervical exam deferred        Extremities: Normal range of motion.  Edema: None  Mental Status: Normal mood and affect. Normal behavior. Normal judgment and thought content.   Urinalysis: Urine Protein: Negative Urine Glucose: Negative  Assessment and Plan:  Pregnancy: G1P0000 at [redacted]w[redacted]d  1. Encounter for supervision of normal first pregnancy in second trimester     Possible abnormality in fetus, possible single kidney     Repeat US scheduled with MFM  Preterm labor symptoms and general obstetric precautions including but not limited to vaginal bleeding, contractions, leaking of fluid and fetal movement were  reviewed in detail with the patient. Please refer to After Visit Summary for other counseling recommendations.  Return in about 4 weeks (around 03/02/2015) for [facility].   Aviva Signs, CNM

## 2015-02-02 NOTE — Patient Instructions (Signed)

## 2015-02-14 ENCOUNTER — Encounter: Payer: Self-pay | Admitting: Obstetrics and Gynecology

## 2015-03-02 ENCOUNTER — Ambulatory Visit (HOSPITAL_COMMUNITY)
Admission: RE | Admit: 2015-03-02 | Discharge: 2015-03-02 | Disposition: A | Discharge status: Home / Self Care | Payer: Medicaid Other | Source: Ambulatory Visit | Attending: Student | Admitting: Student

## 2015-03-02 ENCOUNTER — Encounter (HOSPITAL_COMMUNITY): Payer: Self-pay

## 2015-03-02 ENCOUNTER — Ambulatory Visit (INDEPENDENT_AMBULATORY_CARE_PROVIDER_SITE_OTHER): Payer: Medicaid Other | Admitting: Obstetrics and Gynecology

## 2015-03-02 VITALS — BP 120/58 | HR 73 | Temp 98.3°F | Wt 147.7 lb

## 2015-03-02 DIAGNOSIS — Z3A23 23 weeks gestation of pregnancy: Secondary | ICD-10-CM | POA: Insufficient documentation

## 2015-03-02 DIAGNOSIS — O359XX0 Maternal care for (suspected) fetal abnormality and damage, unspecified, not applicable or unspecified: Secondary | ICD-10-CM | POA: Insufficient documentation

## 2015-03-02 DIAGNOSIS — O358XX0 Maternal care for other (suspected) fetal abnormality and damage, not applicable or unspecified: Secondary | ICD-10-CM | POA: Insufficient documentation

## 2015-03-02 DIAGNOSIS — Z3402 Encounter for supervision of normal first pregnancy, second trimester: Secondary | ICD-10-CM

## 2015-03-02 DIAGNOSIS — O283 Abnormal ultrasonic finding on antenatal screening of mother: Secondary | ICD-10-CM

## 2015-03-02 LAB — POCT URINALYSIS DIP (DEVICE)
BILIRUBIN URINE: NEGATIVE
Glucose, UA: NEGATIVE mg/dL
HGB URINE DIPSTICK: NEGATIVE
Ketones, ur: NEGATIVE mg/dL
Leukocytes, UA: NEGATIVE
NITRITE: NEGATIVE
PH: 7 (ref 5.0–8.0)
PROTEIN: NEGATIVE mg/dL
Specific Gravity, Urine: 1.015 (ref 1.005–1.030)
Urobilinogen, UA: 0.2 mg/dL (ref 0.0–1.0)

## 2015-03-02 NOTE — Progress Notes (Signed)
Subjective:  Misty Allen is a 30 y.o. G1P0000 at [redacted]w[redacted]d being seen today for ongoing prenatal care.  She is currently monitored for the following issues for this low-risk pregnancy and has Supervision of normal first pregnancy on her problem list. [place] today with final report pending. Per patient: single kidney, normal fetal growth and [place] scheduled for next month.  Patient reports fatigue and rash on trunk.  Contractions: Not present.  .  Movement: Absent. Denies leaking of fluid.   The following portions of the patient's history were reviewed and updated as appropriate: allergies, current medications, past family history, past medical history, past social history, past surgical history and problem list. Problem list updated.  Objective:   Filed Vitals:   03/02/15 1548  BP: 120/58  Pulse: 73  Temp: 98.3 F (36.8 C)  Weight: 147 lb 11.2 oz (66.996 kg)    Fetal Status: Fetal Heart Rate (bpm): 156   Movement: Absent     General:  Alert, oriented and cooperative. Patient is in no acute distress.  Skin: Skin is warm and dry. No rash noted.   Cardiovascular: Normal heart rate noted  Respiratory: Normal respiratory effort, no problems with respiration noted  Abdomen: Soft, gravid, appropriate for gestational age. Pain/Pressure: Present     Pelvic:       Cervical exam deferred        Extremities: Normal range of motion.     Mental Status: Normal mood and affect. Normal behavior. Normal judgment and thought content.  Skin: small areas of pruritic 2-3 mm papules, red base at waistline on left and right sides. Left LE with superficial varicosity. Urinalysis:      Assessment and Plan:  Pregnancy: G1P0000 at [redacted]w[redacted]d  1. Encounter for supervision of normal first pregnancy in second trimester Single fetal kidney Rash> advised topical 1% hydrocortisone cream Preterm labor symptoms and general obstetric precautions including but not limited to vaginal bleeding, contractions, leaking of fluid  and fetal movement were reviewed in detail with the patient. Please refer to After Visit Summary for other counseling recommendations.  Return in about 1 month (around 03/30/2015). F/U MFM [place] in 1 month  Felder Lebeda Colin Mulders, CNM

## 2015-03-02 NOTE — Patient Instructions (Signed)

## 2015-03-30 ENCOUNTER — Encounter (HOSPITAL_COMMUNITY): Payer: Self-pay

## 2015-03-30 ENCOUNTER — Other Ambulatory Visit: Payer: Medicaid Other

## 2015-03-30 ENCOUNTER — Ambulatory Visit (INDEPENDENT_AMBULATORY_CARE_PROVIDER_SITE_OTHER): Payer: Medicaid Other | Admitting: Family

## 2015-03-30 ENCOUNTER — Other Ambulatory Visit (HOSPITAL_COMMUNITY): Payer: Self-pay | Admitting: Maternal and Fetal Medicine

## 2015-03-30 ENCOUNTER — Ambulatory Visit (HOSPITAL_COMMUNITY)
Admission: RE | Admit: 2015-03-30 | Discharge: 2015-03-30 | Disposition: A | Discharge status: Home / Self Care | Payer: Medicaid Other | Source: Ambulatory Visit | Attending: Obstetrics and Gynecology | Admitting: Obstetrics and Gynecology

## 2015-03-30 ENCOUNTER — Other Ambulatory Visit: Payer: Self-pay | Admitting: Family

## 2015-03-30 VITALS — BP 121/67 | HR 90 | Temp 97.7°F | Wt 157.6 lb

## 2015-03-30 DIAGNOSIS — O283 Abnormal ultrasonic finding on antenatal screening of mother: Secondary | ICD-10-CM

## 2015-03-30 DIAGNOSIS — Z3A27 27 weeks gestation of pregnancy: Secondary | ICD-10-CM

## 2015-03-30 DIAGNOSIS — O359XX0 Maternal care for (suspected) fetal abnormality and damage, unspecified, not applicable or unspecified: Secondary | ICD-10-CM

## 2015-03-30 DIAGNOSIS — O358XX0 Maternal care for other (suspected) fetal abnormality and damage, not applicable or unspecified: Secondary | ICD-10-CM

## 2015-03-30 DIAGNOSIS — IMO0001 Reserved for inherently not codable concepts without codable children: Secondary | ICD-10-CM

## 2015-03-30 DIAGNOSIS — Z3402 Encounter for supervision of normal first pregnancy, second trimester: Secondary | ICD-10-CM

## 2015-03-30 LAB — POCT URINALYSIS DIP (DEVICE)
BILIRUBIN URINE: NEGATIVE
Glucose, UA: NEGATIVE mg/dL
HGB URINE DIPSTICK: NEGATIVE
KETONES UR: NEGATIVE mg/dL
Nitrite: NEGATIVE
PH: 5.5 (ref 5.0–8.0)
PROTEIN: NEGATIVE mg/dL
Specific Gravity, Urine: 1.02 (ref 1.005–1.030)
Urobilinogen, UA: 0.2 mg/dL (ref 0.0–1.0)

## 2015-03-30 NOTE — Patient Instructions (Signed)
For compression stockings contact: [facility] [address], [place], [place] [address] [phone]   

## 2015-03-30 NOTE — Progress Notes (Signed)
Subjective:  Misty Allen is a 30 y.o. G1P0000 at 5116w1d being seen today for ongoing prenatal care.  She is currently monitored for the following issues for this high-risk pregnancy and has Supervision of normal first pregnancy and Known fetal anomaly, antepartum on her problem list.  Patient reports increased stretch marks and pedal swelling.  Also concerned about white lines in nail bed.  Requested referral to dermatology.  Contractions: Not present. Vag. Bleeding: None.  Movement: Present. Denies leaking of fluid.   The following portions of the patient's history were reviewed and updated as appropriate: allergies, current medications, past family history, past medical history, past social history, past surgical history and problem list. Problem list updated.  Objective:   Filed Vitals:   03/30/15 1138  BP: 121/67  Pulse: 90  Temp: 97.7 F (36.5 C)  Weight: 157 lb 10.1 oz (71.5 kg)    Fetal Status: Fetal Heart Rate (bpm): 165 Fundal Height: 28 cm Movement: Present     General:  Alert, oriented and cooperative. Patient is in no acute distress.  Skin: Skin is warm and dry. No rash noted.   Cardiovascular: Normal heart rate noted  Respiratory: Normal respiratory effort, no problems with respiration noted  Abdomen: Soft, gravid, appropriate for gestational age. Pain/Pressure: Present     Pelvic: Vag. Bleeding: None     Cervical exam deferred        Extremities: Normal range of motion.  Edema: Moderate pitting, indentation subsides rapidly  Mental Status: Normal mood and affect. Normal behavior. Normal judgment and thought content.   Urinalysis:      Assessment and Plan:  Pregnancy: G1P0000 at 5316w1d  1. Encounter for supervision of normal first pregnancy in second trimester - CBC   2. Known fetal anomaly, antepartum, not applicable or unspecified fetus - Ultrasound today at 2 pm  3.  Possible Nail Infection -  Ambulatory referral to Dermatology - CBC  Preterm labor  symptoms and general obstetric precautions including but not limited to vaginal bleeding, contractions, leaking of fluid and fetal movement were reviewed in detail with the patient. Please refer to After Visit Summary for other counseling recommendations.  No Follow-up on file.   Misty Allen, CNM

## 2015-03-30 NOTE — Addendum Note (Signed)
Addended by: Cheree DittoGRAHAM, Amrit Cress A on: 03/30/2015 04:44 PM   Modules accepted: Orders

## 2015-03-31 ENCOUNTER — Encounter: Payer: Medicaid Other | Admitting: Family

## 2015-03-31 LAB — CBC
HCT: 36.1 % (ref 36.0–46.0)
Hemoglobin: 12 g/dL (ref 12.0–15.0)
MCH: 29 pg (ref 26.0–34.0)
MCHC: 33.2 g/dL (ref 30.0–36.0)
MCV: 87.2 fL (ref 78.0–100.0)
MPV: 11 fL (ref 8.6–12.4)
PLATELETS: 222 10*3/uL (ref 150–400)
RBC: 4.14 MIL/uL (ref 3.87–5.11)
RDW: 14.2 % (ref 11.5–15.5)
WBC: 8.3 10*3/uL (ref 4.0–10.5)

## 2015-03-31 LAB — GLUCOSE TOLERANCE, 1 HOUR (50G) W/O FASTING: GLUCOSE, 1 HR, GESTATIONAL: 66 mg/dL (ref <140)

## 2015-03-31 LAB — HIV ANTIBODY (ROUTINE TESTING W REFLEX): HIV 1&2 Ab, 4th Generation: NONREACTIVE

## 2015-04-01 LAB — RPR

## 2015-04-13 ENCOUNTER — Ambulatory Visit (INDEPENDENT_AMBULATORY_CARE_PROVIDER_SITE_OTHER): Payer: Medicaid Other | Admitting: Family

## 2015-04-13 VITALS — BP 114/65 | HR 85 | Temp 98.3°F | Wt 160.9 lb

## 2015-04-13 DIAGNOSIS — O0992 Supervision of high risk pregnancy, unspecified, second trimester: Secondary | ICD-10-CM

## 2015-04-13 DIAGNOSIS — O359XX0 Maternal care for (suspected) fetal abnormality and damage, unspecified, not applicable or unspecified: Secondary | ICD-10-CM

## 2015-04-13 DIAGNOSIS — R8271 Bacteriuria: Secondary | ICD-10-CM | POA: Diagnosis not present

## 2015-04-13 LAB — POCT URINALYSIS DIP (DEVICE)
BILIRUBIN URINE: NEGATIVE
GLUCOSE, UA: NEGATIVE mg/dL
Hgb urine dipstick: NEGATIVE
KETONES UR: NEGATIVE mg/dL
NITRITE: NEGATIVE
PH: 6 (ref 5.0–8.0)
PROTEIN: NEGATIVE mg/dL
Specific Gravity, Urine: >=1.03 (ref 1.005–1.030)
Urobilinogen, UA: 0.2 mg/dL (ref 0.0–1.0)

## 2015-04-13 NOTE — Progress Notes (Signed)
[employer]  Edema- bilateral lower extremities

## 2015-04-13 NOTE — Patient Instructions (Signed)
Tylenol 325 mg or 500 mg  Cocoa Butter Biogel

## 2015-04-13 NOTE — Progress Notes (Signed)
Subjective:  Misty Allen is a 30 y.o. G1P0000 at 6820w1d being seen today for ongoing prenatal care.  She is currently monitored for the following issues for this high-risk pregnancy and has Supervision of high risk pregnancy, antepartum and Known fetal anomaly, antepartum on her problem list.  Patient reports increased stretch marks and left flank pain.  Denies UTI symptoms or fever.  Contractions: Not present. Vag. Bleeding: None.  Movement: Present. Denies leaking of fluid.   The following portions of the patient's history were reviewed and updated as appropriate: allergies, current medications, past family history, past medical history, past social history, past surgical history and problem list. Problem list updated.  Objective:   Filed Vitals:   04/13/15 1134  BP: 114/65  Pulse: 85  Temp: 98.3 F (36.8 C)  Weight: 160 lb 14.4 oz (72.984 kg)    Fetal Status: Fetal Heart Rate (bpm): 152 Fundal Height: 29 cm Movement: Present     General:  Alert, oriented and cooperative. Patient is in no acute distress.  Skin: Skin is warm and dry. No rash noted.   Cardiovascular: Normal heart rate noted  Respiratory: Normal respiratory effort, no problems with respiration noted  Abdomen: Soft, gravid, appropriate for gestational age. Pain/Pressure: Present     Pelvic: Vag. Bleeding: None Vag D/C Character: Yellow   Cervical exam deferred        Extremities: Normal range of motion.  Edema: Mild pitting, slight indentation  Mental Status: Normal mood and affect. Normal behavior. Normal judgment and thought content.   Urinalysis: Urine Protein: Negative Urine Glucose: Negative  Assessment and Plan:  Pregnancy: G1P0000 at 4320w1d  1. Known fetal anomaly, antepartum, not applicable or unspecified fetus - Repeat ultrasound this week  2. Bacteria in urine - Culture, OB Urine  3. Supervision of high risk pregnancy, antepartum, second trimester - Discussed Tylenol for muscle pains  Preterm  labor symptoms and general obstetric precautions including but not limited to vaginal bleeding, contractions, leaking of fluid and fetal movement were reviewed in detail with the patient. Please refer to After Visit Summary for other counseling recommendations.  Return in about 2 weeks (around 04/27/2015).   Marlis EdelsonWalidah N Karim, CNM    Interpreter [phone]

## 2015-04-19 ENCOUNTER — Ambulatory Visit (HOSPITAL_COMMUNITY)
Admission: RE | Admit: 2015-04-19 | Discharge: 2015-04-19 | Disposition: A | Discharge status: Home / Self Care | Payer: Medicaid Other | Source: Ambulatory Visit | Attending: Obstetrics and Gynecology | Admitting: Obstetrics and Gynecology

## 2015-04-19 ENCOUNTER — Encounter (HOSPITAL_COMMUNITY): Payer: Self-pay

## 2015-04-19 DIAGNOSIS — Z3A30 30 weeks gestation of pregnancy: Secondary | ICD-10-CM | POA: Insufficient documentation

## 2015-04-19 DIAGNOSIS — IMO0001 Reserved for inherently not codable concepts without codable children: Secondary | ICD-10-CM

## 2015-04-19 DIAGNOSIS — O358XX0 Maternal care for other (suspected) fetal abnormality and damage, not applicable or unspecified: Secondary | ICD-10-CM | POA: Insufficient documentation

## 2015-04-20 ENCOUNTER — Other Ambulatory Visit (HOSPITAL_COMMUNITY): Payer: Self-pay | Admitting: *Deleted

## 2015-04-20 DIAGNOSIS — IMO0002 Reserved for concepts with insufficient information to code with codable children: Secondary | ICD-10-CM

## 2015-04-28 ENCOUNTER — Ambulatory Visit (INDEPENDENT_AMBULATORY_CARE_PROVIDER_SITE_OTHER): Payer: Medicaid Other | Admitting: Advanced Practice Midwife

## 2015-04-28 VITALS — BP 120/84 | HR 84 | Temp 98.4°F | Wt 163.7 lb

## 2015-04-28 DIAGNOSIS — O2343 Unspecified infection of urinary tract in pregnancy, third trimester: Secondary | ICD-10-CM

## 2015-04-28 DIAGNOSIS — O359XX0 Maternal care for (suspected) fetal abnormality and damage, unspecified, not applicable or unspecified: Secondary | ICD-10-CM | POA: Diagnosis not present

## 2015-04-28 DIAGNOSIS — O0993 Supervision of high risk pregnancy, unspecified, third trimester: Secondary | ICD-10-CM | POA: Diagnosis not present

## 2015-04-28 LAB — POCT URINALYSIS DIP (DEVICE)
BILIRUBIN URINE: NEGATIVE
Glucose, UA: NEGATIVE mg/dL
Ketones, ur: NEGATIVE mg/dL
Nitrite: NEGATIVE
PH: 7 (ref 5.0–8.0)
Protein, ur: NEGATIVE mg/dL
SPECIFIC GRAVITY, URINE: 1.025 (ref 1.005–1.030)
Urobilinogen, UA: 0.2 mg/dL (ref 0.0–1.0)

## 2015-04-28 MED ORDER — NITROFURANTOIN MONOHYD MACRO 100 MG PO CAPS: 100.0000 mg | ORAL_CAPSULE | Freq: Two times a day (BID) | ORAL | Status: DC

## 2015-04-28 NOTE — Progress Notes (Signed)
Subjective:  Misty Allen is a 30 y.o. G1P0000 at 4624w2d being seen today for ongoing prenatal care.  She is currently monitored for the following issues for this high-risk pregnancy and has Supervision of high risk pregnancy, antepartum and Known fetal anomaly, antepartum on her problem list.  Patient reports dysuria.  Contractions: Not present. Vag. Bleeding: None.  Movement: Present. Denies leaking of fluid.   The following portions of the patient's history were reviewed and updated as appropriate: allergies, current medications, past family history, past medical history, past social history, past surgical history and problem list. Problem list updated.  Objective:   Filed Vitals:   04/28/15 1614  BP: 120/84  Pulse: 84  Temp: 98.4 F (36.9 C)  Weight: 163 lb 11.2 oz (74.254 kg)    Fetal Status: Fetal Heart Rate (bpm): 155   Movement: Present     General:  Alert, oriented and cooperative. Patient is in no acute distress.  Skin: Skin is warm and dry. No rash noted.   Cardiovascular: Normal heart rate noted  Respiratory: Normal respiratory effort, no problems with respiration noted  Abdomen: Soft, gravid, appropriate for gestational age. Pain/Pressure: Present     Pelvic: Vag. Bleeding: None     Cervical exam declined        Extremities: Normal range of motion.  Edema: None  Mental Status: Normal mood and affect. Normal behavior. Normal judgment and thought content.   Urinalysis: Urine Protein: Negative Urine Glucose: Negative    Assessment and Plan:  Pregnancy: G1P0000 at 5324w2d  1. Supervision of high risk pregnancy, antepartum, third trimester   2. UTI  - Culture, OB Urine - Rx Macrobid  3. Fetal kidney Dz  - Growth US Q4 weeks  Preterm labor symptoms and general obstetric precautions including but not limited to vaginal bleeding, contractions, leaking of fluid and fetal movement were reviewed in detail with the patient. Please refer to After Visit Summary for  other counseling recommendations.  Return in about 2 weeks (around 05/12/2015).   Dorathy KinsmanVirginia Chaunice Obie, CNM

## 2015-04-28 NOTE — Progress Notes (Signed)
[employer] [id number]/ Interpreter Nuha Muhammad   Pain- lower abd

## 2015-04-28 NOTE — Patient Instructions (Addendum)
Pregnancy and Urinary Tract Infection °A urinary tract infection (UTI) is a bacterial infection of the urinary tract. Infection of the urinary tract can include the ureters, kidneys (pyelonephritis), bladder (cystitis), and urethra (urethritis). All pregnant women should be screened for bacteria in the urinary tract. Identifying and treating a UTI will decrease the risk of preterm labor and developing more serious infections in both the mother and baby. °CAUSES °Bacteria germs cause almost all UTIs.  °RISK FACTORS °Many factors can increase your chances of getting a UTI during pregnancy. These include: °· Having a short urethra. °· Poor toilet and hygiene habits. °· Sexual intercourse. °· Blockage of urine along the urinary tract. °· Problems with the pelvic muscles or nerves. °· Diabetes. °· Obesity. °· Bladder problems after having several children. °· Previous history of UTI. °SIGNS AND SYMPTOMS  °· Pain, burning, or a stinging feeling when urinating. °· Suddenly feeling the need to urinate right away (urgency). °· Loss of bladder control (urinary incontinence). °· Frequent urination, more than is common with pregnancy. °· Lower abdominal or back discomfort. °· Cloudy urine. °· Blood in the urine (hematuria). °· Fever.  °When the kidneys are infected, the symptoms may be: °· Back pain. °· Flank pain on the right side more so than the left. °· Fever. °· Chills. °· Nausea. °· Vomiting. °DIAGNOSIS  °A urinary tract infection is usually diagnosed through urine tests. Additional tests and procedures are sometimes done. These may include: °· Ultrasound exam of the kidneys, ureters, bladder, and urethra. °· Looking in the bladder with a lighted tube (cystoscopy). °TREATMENT °Typically, UTIs can be treated with antibiotic medicines.  °HOME CARE INSTRUCTIONS  °· Only take over-the-counter or prescription medicines as directed by your health care provider. If you were prescribed antibiotics, take them as directed. Finish  them even if you start to feel better. °· Drink enough fluids to keep your urine clear or pale yellow. °· Do not have sexual intercourse until the infection is gone and your health care provider says it is okay. °· Make sure you are tested for UTIs throughout your pregnancy. These infections often come back.  °Preventing a UTI in the Future °· Practice good toilet habits. Always wipe from front to back. Use the tissue only once. °· Do not hold your urine. Empty your bladder as soon as possible when the urge comes. °· Do not douche or use deodorant sprays. °· Wash with soap and warm water around the genital area and the anus. °· Empty your bladder before and after sexual intercourse. °· Wear underwear with a cotton crotch. °· Avoid caffeine and carbonated drinks. They can irritate the bladder. °· Drink cranberry juice or take cranberry pills. This may decrease the risk of getting a UTI. °· Do not drink alcohol. °· Keep all your appointments and tests as scheduled.  °SEEK MEDICAL CARE IF:  °· Your symptoms get worse. °· You are still having fevers 2 or more days after treatment begins. °· You have a rash. °· You feel that you are having problems with medicines prescribed. °· You have abnormal vaginal discharge. °SEEK IMMEDIATE MEDICAL CARE IF:  °· You have back or flank pain. °· You have chills. °· You have blood in your urine. °· You have nausea and vomiting. °· You have contractions of your uterus. °· You have a gush of fluid from the vagina. °MAKE SURE YOU: °· Understand these instructions.   °· Will watch your condition.   °· Will get help right away if you are not doing   well or get worse.   °  °This information is not intended to replace advice given to you by your health care provider. Make sure you discuss any questions you have with your health care provider. °  °Document Released: 04/28/2010 Document Revised: 10/22/2012 Document Reviewed: 07/31/2012 °Elsevier Interactive Patient Education ©2016 Elsevier  Inc[employer] °Preterm Labor Information °Preterm labor is when labor starts at less than 37 weeks of pregnancy. The normal length of a pregnancy is 39 to 41 weeks. °CAUSES °Often, there is no identifiable underlying cause as to why a woman goes into preterm labor. One of the most common known causes of preterm labor is infection. Infections of the uterus, cervix, vagina, amniotic sac, bladder, kidney, or even the lungs (pneumonia) can cause labor to start. Other suspected causes of preterm labor include:  °· Urogenital infections, such as yeast infections and bacterial vaginosis.   °· Uterine abnormalities (uterine shape, uterine septum, fibroids, or bleeding from the placenta).   °· A cervix that has been operated on (it may fail to stay closed).   °· Malformations in the fetus.   °· Multiple gestations (twins, triplets, and so on).   °· Breakage of the amniotic sac.   °RISK FACTORS °· Having a previous history of preterm labor.   °· Having premature rupture of membranes (PROM).   °· Having a placenta that covers the opening of the cervix (placenta previa).   °· Having a placenta that separates from the uterus (placental abruption).   °· Having a cervix that is too weak to hold the fetus in the uterus (incompetent cervix).   °· Having too much fluid in the amniotic sac (polyhydramnios).   °· Taking illegal drugs or smoking while pregnant.   °· Not gaining enough weight while pregnant.   °· Being younger than 18 and older than 28 years old.   °· Having a low socioeconomic status.   °· Being African American. °SYMPTOMS °Signs and symptoms of preterm labor include:  °· Menstrual-like cramps, abdominal pain, or back pain. °· Uterine contractions that are regular, as frequent as six in an hour, regardless of their intensity (may be mild or painful). °· Contractions that start on the top of the uterus and spread down to the lower abdomen and back.   °· A sense of increased pelvic pressure.   °· A watery or bloody mucus discharge  that comes from the vagina.   °TREATMENT °Depending on the length of the pregnancy and other circumstances, your health care provider may suggest bed rest. If necessary, there are medicines that can be given to stop contractions and to mature the fetal lungs. If labor happens before 34 weeks of pregnancy, a prolonged hospital stay may be recommended. Treatment depends on the condition of both you and the fetus.  °WHAT SHOULD YOU DO IF YOU THINK YOU ARE IN PRETERM LABOR? °Call your health care provider right away. You will need to go to the hospital to get checked immediately. °HOW CAN YOU PREVENT PRETERM LABOR IN FUTURE PREGNANCIES? °You should:  °· Stop smoking if you smoke.  °· Maintain healthy weight gain and avoid chemicals and drugs that are not necessary. °· Be watchful for any type of infection. °· Inform your health care provider if you have a known history of preterm labor. °  °This information is not intended to replace advice given to you by your health care provider. Make sure you discuss any questions you have with your health care provider. °  °Document Released: 03/24/2003 Document Revised: 09/03/2012 Document Reviewed: 02/04/2012 °Elsevier Interactive Patient Education ©2016 Elsevier Inc. ° °

## 2015-04-29 LAB — CULTURE, OB URINE

## 2015-05-17 ENCOUNTER — Other Ambulatory Visit (HOSPITAL_COMMUNITY): Payer: Self-pay | Admitting: Obstetrics and Gynecology

## 2015-05-17 ENCOUNTER — Ambulatory Visit (INDEPENDENT_AMBULATORY_CARE_PROVIDER_SITE_OTHER): Payer: Medicaid Other | Admitting: Advanced Practice Midwife

## 2015-05-17 ENCOUNTER — Encounter (HOSPITAL_COMMUNITY): Payer: Self-pay

## 2015-05-17 ENCOUNTER — Encounter: Payer: Self-pay | Admitting: Advanced Practice Midwife

## 2015-05-17 ENCOUNTER — Ambulatory Visit (HOSPITAL_COMMUNITY)
Admission: RE | Admit: 2015-05-17 | Discharge: 2015-05-17 | Disposition: A | Discharge status: Home / Self Care | Payer: Medicaid Other | Source: Ambulatory Visit | Attending: Obstetrics and Gynecology | Admitting: Obstetrics and Gynecology

## 2015-05-17 VITALS — BP 117/80 | HR 86 | Wt 167.0 lb

## 2015-05-17 VITALS — BP 110/71 | HR 87 | Wt 167.2 lb

## 2015-05-17 DIAGNOSIS — O0993 Supervision of high risk pregnancy, unspecified, third trimester: Secondary | ICD-10-CM

## 2015-05-17 DIAGNOSIS — Z603 Acculturation difficulty: Secondary | ICD-10-CM | POA: Insufficient documentation

## 2015-05-17 DIAGNOSIS — IMO0002 Reserved for concepts with insufficient information to code with codable children: Secondary | ICD-10-CM

## 2015-05-17 DIAGNOSIS — O358XX0 Maternal care for other (suspected) fetal abnormality and damage, not applicable or unspecified: Secondary | ICD-10-CM | POA: Diagnosis present

## 2015-05-17 DIAGNOSIS — Z3A34 34 weeks gestation of pregnancy: Secondary | ICD-10-CM | POA: Diagnosis not present

## 2015-05-17 LAB — POCT URINALYSIS DIP (DEVICE)
Bilirubin Urine: NEGATIVE
GLUCOSE, UA: NEGATIVE mg/dL
Ketones, ur: NEGATIVE mg/dL
Nitrite: NEGATIVE
PROTEIN: NEGATIVE mg/dL
Specific Gravity, Urine: >=1.03 (ref 1.005–1.030)
UROBILINOGEN UA: 0.2 mg/dL (ref 0.0–1.0)
pH: 6.5 (ref 5.0–8.0)

## 2015-05-17 MED ORDER — TERCONAZOLE 0.4 % VA CREA: 1.0000 | TOPICAL_CREAM | Freq: Every day | VAGINAL | Status: DC

## 2015-05-17 NOTE — Patient Instructions (Signed)

## 2015-05-17 NOTE — Progress Notes (Signed)
Subjective:  Misty Allen is a 30 y.o. G1P0000 at 3953w0d being seen today for ongoing prenatal care.  She is currently monitored for the following issues for this high-risk pregnancy and has Supervision of high risk pregnancy, antepartum; Known fetal anomaly, antepartum; and Language barrier, cultural differences on her problem list.  Patient reports vaginal irritation and due to recent antibiotics, declines exam.  Contractions: Not present.  .  Movement: Present. Denies leaking of fluid.   Husband translating for her today. He states they want to be induced at 36 weeks so they will be ready for traveling. States they were told by a doctor that they could be.   When pressed for a name, states they looked it up on internet and saw it was safe to deliver at 36 weeks.   The following portions of the patient's history were reviewed and updated as appropriate: allergies, current medications, past family history, past medical history, past social history, past surgical history and problem list. Problem list updated.  Objective:   Filed Vitals:   05/17/15 1437  BP: 117/80  Pulse: 86  Weight: 167 lb (75.751 kg)    Fetal Status: Fetal Heart Rate (bpm): 154 Fundal Height: 34 cm Movement: Present     General:  Alert, oriented and cooperative. Patient is in no acute distress.  Skin: Skin is warm and dry. No rash noted.   Cardiovascular: Normal heart rate noted  Respiratory: Normal respiratory effort, no problems with respiration noted  Abdomen: Soft, gravid, appropriate for gestational age. Pain/Pressure: Present     Pelvic:       Cervical exam deferred        Extremities: Normal range of motion.     Mental Status: Normal mood and affect. Normal behavior. Normal judgment and thought content.   Urinalysis:      Assessment and Plan:  Pregnancy: G1P0000 at 5953w0d  1. Supervision of high risk pregnancy, antepartum, third trimester     Discussed we do not induce labor before 41 weeks without a  medical indication.  No medical indication unless MFM recommends for fetal reasons. Discussed risk of unnecessary C/S with IOL in unfavorable setting.   2. Language barrier, cultural differences     Pt states she wants husband to translate  Preterm labor symptoms and general obstetric precautions including but not limited to vaginal bleeding, contractions, leaking of fluid and fetal movement were reviewed in detail with the patient. Please refer to After Visit Summary for other counseling recommendations.  Return in about 2 weeks (around 05/31/2015) for [facility].   Aviva SignsMarie L Makaela Cando, CNM

## 2015-05-17 NOTE — Progress Notes (Signed)
Pt c/o itching in vagina area and would like to be check.

## 2015-05-18 ENCOUNTER — Other Ambulatory Visit (HOSPITAL_COMMUNITY): Payer: Self-pay | Admitting: *Deleted

## 2015-05-24 ENCOUNTER — Encounter (HOSPITAL_COMMUNITY): Payer: Self-pay

## 2015-05-24 ENCOUNTER — Other Ambulatory Visit (HOSPITAL_COMMUNITY): Payer: Self-pay | Admitting: Maternal and Fetal Medicine

## 2015-05-24 ENCOUNTER — Ambulatory Visit (HOSPITAL_COMMUNITY)
Admission: RE | Admit: 2015-05-24 | Discharge: 2015-05-24 | Disposition: A | Discharge status: Home / Self Care | Payer: Medicaid Other | Source: Ambulatory Visit | Attending: Obstetrics and Gynecology | Admitting: Obstetrics and Gynecology

## 2015-05-24 VITALS — BP 115/73 | HR 86 | Wt 169.2 lb

## 2015-05-24 DIAGNOSIS — O0993 Supervision of high risk pregnancy, unspecified, third trimester: Secondary | ICD-10-CM

## 2015-05-24 DIAGNOSIS — O358XX0 Maternal care for other (suspected) fetal abnormality and damage, not applicable or unspecified: Secondary | ICD-10-CM | POA: Insufficient documentation

## 2015-05-24 DIAGNOSIS — Z3A35 35 weeks gestation of pregnancy: Secondary | ICD-10-CM

## 2015-05-24 DIAGNOSIS — IMO0001 Reserved for inherently not codable concepts without codable children: Secondary | ICD-10-CM

## 2015-05-24 DIAGNOSIS — O365930 Maternal care for other known or suspected poor fetal growth, third trimester, not applicable or unspecified: Secondary | ICD-10-CM | POA: Insufficient documentation

## 2015-05-31 ENCOUNTER — Other Ambulatory Visit (HOSPITAL_COMMUNITY): Payer: Self-pay | Admitting: Maternal and Fetal Medicine

## 2015-05-31 ENCOUNTER — Ambulatory Visit (INDEPENDENT_AMBULATORY_CARE_PROVIDER_SITE_OTHER): Payer: Medicaid Other | Admitting: Family

## 2015-05-31 ENCOUNTER — Encounter (HOSPITAL_COMMUNITY): Payer: Self-pay

## 2015-05-31 ENCOUNTER — Other Ambulatory Visit (HOSPITAL_COMMUNITY)
Admission: RE | Admit: 2015-05-31 | Discharge: 2015-05-31 | Disposition: A | Discharge status: Home / Self Care | Payer: Medicaid Other | Source: Ambulatory Visit | Attending: Family | Admitting: Family

## 2015-05-31 ENCOUNTER — Ambulatory Visit (HOSPITAL_COMMUNITY)
Admission: RE | Admit: 2015-05-31 | Discharge: 2015-05-31 | Disposition: A | Discharge status: Home / Self Care | Payer: Medicaid Other | Source: Ambulatory Visit | Attending: Obstetrics and Gynecology | Admitting: Obstetrics and Gynecology

## 2015-05-31 VITALS — BP 117/75 | HR 87 | Wt 172.5 lb

## 2015-05-31 VITALS — BP 109/71 | HR 87 | Wt 172.6 lb

## 2015-05-31 DIAGNOSIS — Z23 Encounter for immunization: Secondary | ICD-10-CM | POA: Diagnosis not present

## 2015-05-31 DIAGNOSIS — IMO0001 Reserved for inherently not codable concepts without codable children: Secondary | ICD-10-CM

## 2015-05-31 DIAGNOSIS — Z603 Acculturation difficulty: Secondary | ICD-10-CM

## 2015-05-31 DIAGNOSIS — O358XX0 Maternal care for other (suspected) fetal abnormality and damage, not applicable or unspecified: Secondary | ICD-10-CM | POA: Insufficient documentation

## 2015-05-31 DIAGNOSIS — O365930 Maternal care for other known or suspected poor fetal growth, third trimester, not applicable or unspecified: Secondary | ICD-10-CM | POA: Insufficient documentation

## 2015-05-31 DIAGNOSIS — Z113 Encounter for screening for infections with a predominantly sexual mode of transmission: Secondary | ICD-10-CM | POA: Diagnosis not present

## 2015-05-31 DIAGNOSIS — Z3483 Encounter for supervision of other normal pregnancy, third trimester: Secondary | ICD-10-CM | POA: Diagnosis present

## 2015-05-31 DIAGNOSIS — O0990 Supervision of high risk pregnancy, unspecified, unspecified trimester: Secondary | ICD-10-CM

## 2015-05-31 DIAGNOSIS — O0993 Supervision of high risk pregnancy, unspecified, third trimester: Secondary | ICD-10-CM

## 2015-05-31 DIAGNOSIS — Z3A36 36 weeks gestation of pregnancy: Secondary | ICD-10-CM

## 2015-05-31 LAB — POCT URINALYSIS DIP (DEVICE)
Bilirubin Urine: NEGATIVE
GLUCOSE, UA: NEGATIVE mg/dL
Ketones, ur: NEGATIVE mg/dL
Nitrite: NEGATIVE
PROTEIN: NEGATIVE mg/dL
SPECIFIC GRAVITY, URINE: 1.02 (ref 1.005–1.030)
Urobilinogen, UA: 0.2 mg/dL (ref 0.0–1.0)
pH: 6.5 (ref 5.0–8.0)

## 2015-05-31 LAB — OB RESULTS CONSOLE GC/CHLAMYDIA: Gonorrhea: NEGATIVE

## 2015-05-31 LAB — OB RESULTS CONSOLE GBS: GBS: POSITIVE

## 2015-05-31 MED ORDER — TETANUS-DIPHTH-ACELL PERTUSSIS 5-2.5-18.5 LF-MCG/0.5 IM SUSP
0.5000 mL | Freq: Once | INTRAMUSCULAR | Status: AC
  Administered 2015-05-31: 0.5 mL via INTRAMUSCULAR

## 2015-05-31 NOTE — Progress Notes (Signed)
Subjective:  Misty Allen is a 30 y.o. G1P0000 at 4830w0d being seen today for ongoing prenatal care.  She is currently monitored for the following issues for this low-risk pregnancy and has Supervision of high risk pregnancy, antepartum; Known fetal anomaly, antepartum; and Language barrier, cultural differences on her problem list.  Patient reports no complaints.  Contractions: Not present.  .  Movement: Present. Denies leaking of fluid.   The following portions of the patient's history were reviewed and updated as appropriate: allergies, current medications, past family history, past medical history, past social history, past surgical history and problem list. Problem list updated.  Objective:   Filed Vitals:   05/31/15 1429  BP: 109/71  Pulse: 87  Weight: 172 lb 9.9 oz (78.3 kg)    Fetal Status: Fetal Heart Rate (bpm): 155 Fundal Height: 37 cm Movement: Present     General:  Alert, oriented and cooperative. Patient is in no acute distress.  Skin: Skin is warm and dry. No rash noted.   Cardiovascular: Normal heart rate noted  Respiratory: Normal respiratory effort, no problems with respiration noted  Abdomen: Soft, gravid, appropriate for gestational age. Pain/Pressure: Present     Pelvic:       Cervical exam deferred        Extremities: Normal range of motion.  Edema: None  Mental Status: Normal mood and affect. Normal behavior. Normal judgment and thought content.   Urinalysis: Urine Protein: Negative Urine Glucose: Negative  Assessment and Plan:  Pregnancy: G1P0000 at 1630w0d  1. Supervision of high risk pregnancy, antepartum, third trimester - GC/CT collected - GBS  2. Language barrier, cultural differences - Pt asked to have husband interpret  3.  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 Please refer to After Visit Summary for other counseling recommendations.  Return in about 1 week (around 06/07/2015).   Eino FarberWalidah Kennith GainN Karim, CNM

## 2015-05-31 NOTE — Addendum Note (Signed)
Addended by: Gerome ApleyZEYFANG, Shamarcus Hoheisel L on: 05/31/2015 04:12 PM   Modules accepted: Orders

## 2015-05-31 NOTE — Addendum Note (Signed)
Addended by: Gerome ApleyZEYFANG, LINDA L on: 05/31/2015 04:18 PM   Modules accepted: Orders

## 2015-06-01 LAB — CULTURE, BETA STREP (GROUP B ONLY)

## 2015-06-01 LAB — GC/CHLAMYDIA PROBE AMP (~~LOC~~) NOT AT ARMC
Chlamydia: NEGATIVE
NEISSERIA GONORRHEA: NEGATIVE

## 2015-06-07 ENCOUNTER — Ambulatory Visit (HOSPITAL_COMMUNITY)
Admission: RE | Admit: 2015-06-07 | Discharge: 2015-06-07 | Disposition: A | Discharge status: Home / Self Care | Payer: Medicaid Other | Source: Ambulatory Visit | Attending: Obstetrics and Gynecology | Admitting: Obstetrics and Gynecology

## 2015-06-07 ENCOUNTER — Other Ambulatory Visit (HOSPITAL_COMMUNITY): Payer: Self-pay | Admitting: Maternal and Fetal Medicine

## 2015-06-07 ENCOUNTER — Encounter (HOSPITAL_COMMUNITY): Payer: Self-pay

## 2015-06-07 VITALS — BP 118/75 | HR 67 | Wt 173.8 lb

## 2015-06-07 DIAGNOSIS — Z3A37 37 weeks gestation of pregnancy: Secondary | ICD-10-CM | POA: Diagnosis not present

## 2015-06-07 DIAGNOSIS — O358XX0 Maternal care for other (suspected) fetal abnormality and damage, not applicable or unspecified: Secondary | ICD-10-CM

## 2015-06-07 DIAGNOSIS — O365930 Maternal care for other known or suspected poor fetal growth, third trimester, not applicable or unspecified: Secondary | ICD-10-CM

## 2015-06-07 DIAGNOSIS — O0990 Supervision of high risk pregnancy, unspecified, unspecified trimester: Secondary | ICD-10-CM

## 2015-06-07 DIAGNOSIS — IMO0001 Reserved for inherently not codable concepts without codable children: Secondary | ICD-10-CM

## 2015-06-09 ENCOUNTER — Telehealth (HOSPITAL_COMMUNITY): Payer: Self-pay | Admitting: *Deleted

## 2015-06-09 ENCOUNTER — Ambulatory Visit (INDEPENDENT_AMBULATORY_CARE_PROVIDER_SITE_OTHER): Payer: Medicaid Other | Admitting: Family

## 2015-06-09 VITALS — BP 122/79 | HR 67 | Wt 173.9 lb

## 2015-06-09 DIAGNOSIS — O365990 Maternal care for other known or suspected poor fetal growth, unspecified trimester, not applicable or unspecified: Secondary | ICD-10-CM | POA: Diagnosis not present

## 2015-06-09 DIAGNOSIS — O359XX0 Maternal care for (suspected) fetal abnormality and damage, unspecified, not applicable or unspecified: Secondary | ICD-10-CM

## 2015-06-09 DIAGNOSIS — O0993 Supervision of high risk pregnancy, unspecified, third trimester: Secondary | ICD-10-CM

## 2015-06-09 LAB — POCT URINALYSIS DIP (DEVICE)
BILIRUBIN URINE: NEGATIVE
GLUCOSE, UA: NEGATIVE mg/dL
KETONES UR: NEGATIVE mg/dL
Nitrite: NEGATIVE
Protein, ur: NEGATIVE mg/dL
SPECIFIC GRAVITY, URINE: 1.015 (ref 1.005–1.030)
UROBILINOGEN UA: 0.2 mg/dL (ref 0.0–1.0)
pH: 7 (ref 5.0–8.0)

## 2015-06-09 NOTE — Telephone Encounter (Signed)
Preadmission screen Interpreter number [phone]

## 2015-06-09 NOTE — Progress Notes (Signed)
Subjective:  Misty Allen is a 30 y.o. G1P0000 at 659w2d being seen today for ongoing prenatal care.  She is currently monitored for the following issues for this high-risk pregnancy and has Supervision of high risk pregnancy, antepartum; Known fetal anomaly, antepartum; Language barrier, cultural differences; and Pregnancy affected by fetal growth restriction on her problem list.  Patient reports no complaints.  Contractions: Not present.  .  Movement: Present. Denies leaking of fluid.   The following portions of the patient's history were reviewed and updated as appropriate: allergies, current medications, past family history, past medical history, past social history, past surgical history and problem list. Problem list updated.  Objective:   Filed Vitals:   06/09/15 0804  BP: 122/79  Pulse: 67  Weight: 173 lb 14.4 oz (78.881 kg)    Fetal Status: Fetal Heart Rate (bpm): 140 Fundal Height: 35 cm Movement: Present     General:  Alert, oriented and cooperative. Patient is in no acute distress.  Skin: Skin is warm and dry. No rash noted.   Cardiovascular: Normal heart rate noted  Respiratory: Normal respiratory effort, no problems with respiration noted  Abdomen: Soft, gravid, appropriate for gestational age. Pain/Pressure: Present     Pelvic:       Cervical exam deferred        Extremities: Normal range of motion.  Edema: None  Mental Status: Normal mood and affect. Normal behavior. Normal judgment and thought content.   Urinalysis:      Assessment and Plan:  Pregnancy: G1P0000 at 529w2d  1. Supervision of high risk pregnancy, antepartum, third trimester - Keep appt for next week  2. Pregnancy affected by fetal growth restriction - IOL on 06/17/15 - Discussed various methods of IOL, explained process can last 2-3 days - BPP/doppler on 06/14/15  3. Known fetal anomaly, antepartum, not applicable or unspecified fetus - Peds at delivery  Term labor symptoms and general  obstetric precautions including but not limited to vaginal bleeding, contractions, leaking of fluid and fetal movement were reviewed in detail with the patient. Please refer to After Visit Summary for other counseling recommendations.  Return in about 1 week (around 06/16/2015).   Eino FarberWalidah Kennith GainN Karim, CNM

## 2015-06-10 ENCOUNTER — Encounter: Payer: Self-pay | Admitting: *Deleted

## 2015-06-14 ENCOUNTER — Encounter (HOSPITAL_COMMUNITY): Payer: Self-pay

## 2015-06-14 ENCOUNTER — Ambulatory Visit (INDEPENDENT_AMBULATORY_CARE_PROVIDER_SITE_OTHER): Payer: Medicaid Other | Admitting: Family

## 2015-06-14 ENCOUNTER — Other Ambulatory Visit (HOSPITAL_COMMUNITY): Payer: Self-pay | Admitting: Maternal and Fetal Medicine

## 2015-06-14 ENCOUNTER — Ambulatory Visit (HOSPITAL_COMMUNITY)
Admission: RE | Admit: 2015-06-14 | Discharge: 2015-06-14 | Disposition: A | Discharge status: Home / Self Care | Payer: Medicaid Other | Source: Ambulatory Visit | Attending: Obstetrics and Gynecology | Admitting: Obstetrics and Gynecology

## 2015-06-14 VITALS — BP 115/74 | HR 89 | Wt 173.0 lb

## 2015-06-14 VITALS — BP 114/70 | HR 70 | Wt 174.0 lb

## 2015-06-14 DIAGNOSIS — O358XX0 Maternal care for other (suspected) fetal abnormality and damage, not applicable or unspecified: Secondary | ICD-10-CM

## 2015-06-14 DIAGNOSIS — Z603 Acculturation difficulty: Secondary | ICD-10-CM

## 2015-06-14 DIAGNOSIS — O359XX0 Maternal care for (suspected) fetal abnormality and damage, unspecified, not applicable or unspecified: Secondary | ICD-10-CM

## 2015-06-14 DIAGNOSIS — Z3A38 38 weeks gestation of pregnancy: Secondary | ICD-10-CM | POA: Insufficient documentation

## 2015-06-14 DIAGNOSIS — O0993 Supervision of high risk pregnancy, unspecified, third trimester: Secondary | ICD-10-CM

## 2015-06-14 DIAGNOSIS — IMO0001 Reserved for inherently not codable concepts without codable children: Secondary | ICD-10-CM

## 2015-06-14 DIAGNOSIS — O365930 Maternal care for other known or suspected poor fetal growth, third trimester, not applicable or unspecified: Secondary | ICD-10-CM

## 2015-06-14 DIAGNOSIS — O365990 Maternal care for other known or suspected poor fetal growth, unspecified trimester, not applicable or unspecified: Secondary | ICD-10-CM

## 2015-06-14 DIAGNOSIS — IMO0002 Reserved for concepts with insufficient information to code with codable children: Secondary | ICD-10-CM

## 2015-06-14 DIAGNOSIS — O359XX1 Maternal care for (suspected) fetal abnormality and damage, unspecified, fetus 1: Secondary | ICD-10-CM

## 2015-06-14 LAB — POCT URINALYSIS DIP (DEVICE)
BILIRUBIN URINE: NEGATIVE
Bilirubin Urine: NEGATIVE
Glucose, UA: NEGATIVE mg/dL
Glucose, UA: NEGATIVE mg/dL
HGB URINE DIPSTICK: NEGATIVE
HGB URINE DIPSTICK: NEGATIVE
KETONES UR: NEGATIVE mg/dL
KETONES UR: NEGATIVE mg/dL
NITRITE: NEGATIVE
Nitrite: NEGATIVE
PH: 6 (ref 5.0–8.0)
PH: 6.5 (ref 5.0–8.0)
PROTEIN: NEGATIVE mg/dL
Protein, ur: NEGATIVE mg/dL
SPECIFIC GRAVITY, URINE: 1.02 (ref 1.005–1.030)
SPECIFIC GRAVITY, URINE: 1.02 (ref 1.005–1.030)
UROBILINOGEN UA: 0.2 mg/dL (ref 0.0–1.0)
Urobilinogen, UA: 0.2 mg/dL (ref 0.0–1.0)

## 2015-06-14 NOTE — Progress Notes (Signed)
Subjective:  Misty Allen is a 30 y.o. G1P0000 at 7384w0d being seen today for ongoing prenatal care.  She is currently monitored for the following issues for this high-risk pregnancy and has Supervision of high risk pregnancy, antepartum; Known fetal anomaly, antepartum; Language barrier, cultural differences; and Pregnancy affected by fetal growth restriction on her problem list.  Patient reports increased pressure, no consistent contractions.  Contractions: Irregular. Vag. Bleeding: None.  Movement: Present. Denies leaking of fluid.   The following portions of the patient's history were reviewed and updated as appropriate: allergies, current medications, past family history, past medical history, past social history, past surgical history and problem list. Problem list updated.  Objective:   Filed Vitals:   06/14/15 1346  BP: 115/74  Pulse: 89  Weight: 173 lb (78.472 kg)    Fetal Status: Fetal Heart Rate (bpm): 145 Fundal Height: 36 cm Movement: Present  Presentation: Vertex  General:  Alert, oriented and cooperative. Patient is in no acute distress.  Skin: Skin is warm and dry. No rash noted.   Cardiovascular: Normal heart rate noted  Respiratory: Normal respiratory effort, no problems with respiration noted  Abdomen: Soft, gravid, appropriate for gestational age. Pain/Pressure: Present     Pelvic: Vag. Bleeding: None Vag D/C Character: Yellow   Cervical exam performed Dilation: 1 Effacement (%): 50 Station: Ballotable  Extremities: Normal range of motion.  Edema: Trace  Mental Status: Normal mood and affect. Normal behavior. Normal judgment and thought content.   Urinalysis: Urine Protein: Negative Urine Glucose: Negative  Assessment and Plan:  Pregnancy: G1P0000 at 6084w0d  1. Supervision of high risk pregnancy, antepartum, third trimester - IOL on 06/17/15  2. Pregnancy affected by fetal growth restriction - BPP today - IOL on 06/17/15  3. Language barrier, cultural  differences - Interpreter present  4. Known fetal anomaly, antepartum, fetus 1 - Peds to assess  Term labor symptoms and general obstetric precautions including but not limited to vaginal bleeding, contractions, leaking of fluid and fetal movement were reviewed in detail with the patient. Please refer to After Visit Summary for other counseling recommendations.  Return in about 4 weeks (around 07/12/2015). Postpartum   Marlis EdelsonWalidah N Karim, CNM

## 2015-06-14 NOTE — Patient Instructions (Signed)
AREA PEDIATRIC/FAMILY PRACTICE PHYSICIANS  [facility] [address], [address] [place], [place]  [address] Phone - [phone]   Fax - [phone]  [facility] [address] [place], [place] [address] Phone - [phone]   Fax - [phone]  [facility] [address] [place], [place]  [address] Phone - [phone]   Fax - [phone]  [facility] [address] [place], [place]  [address] Phone - [phone]   Fax - [phone]  [facility] [address] [place], [place]  [address] Phone - [phone]   Fax - [phone]  [facility] [address] [place], [place]  [address] Phone - [phone]   Fax - [phone]  [facility] [address] [place], [place]  [address] Phone - [phone]   Fax - [phone]  [facility] [address] [place], [place]  [address] Phone - [phone]   Fax - [phone]  [facility] [address] [place], [place]  [address] Phone - [phone]   Fax - [phone] [facility] [address] [place], [place]  [address] Phone - [phone]   Fax - [phone]  [facility] [address] [place], [place]  [address] Phone - [phone]   Fax - [phone]  [facility] [address] [place], [place]  [address] Phone - [phone]   Fax - [phone]  [facility] [address] [place], [place]  [address] Phone - [phone]   Fax - [phone]  [facility] [address] [place], [place]  [address] Phone - [phone]  [facility] [address] [place], [place]  [address] Phone - [phone]   Fax - [phone]  [facility] [address] [place], [place]  [address] Phone - [phone]   Fax - [phone]  [facility] [address] [place], [place]  [address] Phone - [phone]   Fax - [phone]  [facility] [address] [place], [place]  [address] Phone - [phone]   Fax - [phone]   [facility] [address] [place], [place]  [address] Phone - [phone]   Fax - [phone]  [facility] [address] [place], [place]  [address] Phone - [phone]   Fax - [phone]  [facility] [address] [place], [place]  [address] Phone - [phone]   Fax - [phone]  [facility] [address] [place], [place]  [address] Phone - [phone]   Fax - [phone]  [facility] [address] , [place]  [address] Phone - [phone]   Fax - [phone] [facility] [address] [place], [place]  [address] Phone - [phone]   Fax - [phone]  [facility] [address] [place], [place]  [address] Phone - [phone]   Fax - [phone]  [facility] [address] [place],   [address] Phone - [phone]   Fax - [phone]   

## 2015-06-14 NOTE — Progress Notes (Signed)
Interpreter used for check in Discussed breastfeeding with patient

## 2015-06-17 ENCOUNTER — Inpatient Hospital Stay (HOSPITAL_COMMUNITY)
Admission: RE | Admit: 2015-06-17 | Discharge: 2015-06-19 | DRG: 775 | Disposition: A | Discharge status: Home / Self Care | Payer: Medicaid Other | Source: Ambulatory Visit | Attending: Obstetrics & Gynecology | Admitting: Obstetrics & Gynecology

## 2015-06-17 ENCOUNTER — Encounter (HOSPITAL_COMMUNITY): Payer: Self-pay

## 2015-06-17 VITALS — BP 113/66 | HR 75 | Temp 98.3°F | Resp 18 | Ht 62.0 in | Wt 178.0 lb

## 2015-06-17 DIAGNOSIS — O358XX0 Maternal care for other (suspected) fetal abnormality and damage, not applicable or unspecified: Secondary | ICD-10-CM | POA: Diagnosis present

## 2015-06-17 DIAGNOSIS — Z833 Family history of diabetes mellitus: Secondary | ICD-10-CM

## 2015-06-17 DIAGNOSIS — Z3A38 38 weeks gestation of pregnancy: Secondary | ICD-10-CM | POA: Diagnosis not present

## 2015-06-17 DIAGNOSIS — Z720 Tobacco use: Secondary | ICD-10-CM | POA: Diagnosis not present

## 2015-06-17 DIAGNOSIS — O35EXX0 Maternal care for other (suspected) fetal abnormality and damage, fetal genitourinary anomalies, not applicable or unspecified: Secondary | ICD-10-CM | POA: Diagnosis present

## 2015-06-17 DIAGNOSIS — O99824 Streptococcus B carrier state complicating childbirth: Secondary | ICD-10-CM | POA: Diagnosis present

## 2015-06-17 DIAGNOSIS — Z603 Acculturation difficulty: Secondary | ICD-10-CM

## 2015-06-17 DIAGNOSIS — O365930 Maternal care for other known or suspected poor fetal growth, third trimester, not applicable or unspecified: Secondary | ICD-10-CM | POA: Diagnosis present

## 2015-06-17 DIAGNOSIS — Z8759 Personal history of other complications of pregnancy, childbirth and the puerperium: Secondary | ICD-10-CM

## 2015-06-17 LAB — TYPE AND SCREEN
ABO/RH(D): O POS
Antibody Screen: NEGATIVE

## 2015-06-17 LAB — CBC
HEMATOCRIT: 36.1 % (ref 36.0–46.0)
HEMOGLOBIN: 12.5 g/dL (ref 12.0–15.0)
MCH: 29.4 pg (ref 26.0–34.0)
MCHC: 34.6 g/dL (ref 30.0–36.0)
MCV: 84.9 fL (ref 78.0–100.0)
Platelets: 230 10*3/uL (ref 150–400)
RBC: 4.25 MIL/uL (ref 3.87–5.11)
RDW: 13.5 % (ref 11.5–15.5)
WBC: 8.7 10*3/uL (ref 4.0–10.5)

## 2015-06-17 LAB — RPR: RPR: NONREACTIVE

## 2015-06-17 LAB — ABO/RH: ABO/RH(D): O POS

## 2015-06-17 MED ORDER — DIBUCAINE 1 % RE OINT: 1.0000 "application " | TOPICAL_OINTMENT | RECTAL | Status: DC | PRN

## 2015-06-17 MED ORDER — OXYCODONE-ACETAMINOPHEN 5-325 MG PO TABS: 1.0000 | ORAL_TABLET | ORAL | Status: DC | PRN

## 2015-06-17 MED ORDER — TETANUS-DIPHTH-ACELL PERTUSSIS 5-2.5-18.5 LF-MCG/0.5 IM SUSP: 0.5000 mL | Freq: Once | INTRAMUSCULAR | Status: DC

## 2015-06-17 MED ORDER — IBUPROFEN 600 MG PO TABS
600.0000 mg | ORAL_TABLET | Freq: Four times a day (QID) | ORAL | Status: DC
  Administered 2015-06-17 – 2015-06-19 (×8): 600 mg via ORAL
  Filled 2015-06-17 (×8): qty 1

## 2015-06-17 MED ORDER — OXYTOCIN BOLUS FROM INFUSION
500.0000 mL | INTRAVENOUS | Status: DC
  Administered 2015-06-17: 500 mL via INTRAVENOUS

## 2015-06-17 MED ORDER — FENTANYL 2.5 MCG/ML BUPIVACAINE 1/10 % EPIDURAL INFUSION (WH - ANES)
14.0000 mL/h | INTRAMUSCULAR | Status: DC | PRN
Start: 2015-06-17 — End: 2015-06-17

## 2015-06-17 MED ORDER — WITCH HAZEL-GLYCERIN EX PADS: 1.0000 "application " | MEDICATED_PAD | CUTANEOUS | Status: DC | PRN

## 2015-06-17 MED ORDER — PHENYLEPHRINE 40 MCG/ML (10ML) SYRINGE FOR IV PUSH (FOR BLOOD PRESSURE SUPPORT)
80.0000 ug | PREFILLED_SYRINGE | INTRAVENOUS | Status: DC | PRN
  Filled 2015-06-17: qty 5

## 2015-06-17 MED ORDER — SIMETHICONE 80 MG PO CHEW: 80.0000 mg | CHEWABLE_TABLET | ORAL | Status: DC | PRN

## 2015-06-17 MED ORDER — DIPHENHYDRAMINE HCL 25 MG PO CAPS: 25.0000 mg | ORAL_CAPSULE | Freq: Four times a day (QID) | ORAL | Status: DC | PRN

## 2015-06-17 MED ORDER — MISOPROSTOL 25 MCG QUARTER TABLET
25.0000 ug | ORAL_TABLET | ORAL | Status: DC
  Administered 2015-06-17 (×2): 25 ug via VAGINAL
  Filled 2015-06-17: qty 1
  Filled 2015-06-17: qty 0.25
  Filled 2015-06-17 (×2): qty 1
  Filled 2015-06-17: qty 0.25
  Filled 2015-06-17: qty 1

## 2015-06-17 MED ORDER — ONDANSETRON HCL 4 MG PO TABS: 4.0000 mg | ORAL_TABLET | ORAL | Status: DC | PRN

## 2015-06-17 MED ORDER — EPHEDRINE 5 MG/ML INJ
10.0000 mg | INTRAVENOUS | Status: DC | PRN
  Filled 2015-06-17: qty 2

## 2015-06-17 MED ORDER — ZOLPIDEM TARTRATE 5 MG PO TABS: 5.0000 mg | ORAL_TABLET | Freq: Every evening | ORAL | Status: DC | PRN

## 2015-06-17 MED ORDER — LIDOCAINE HCL (PF) 1 % IJ SOLN
30.0000 mL | INTRAMUSCULAR | Status: DC | PRN
Start: 2015-06-17 — End: 2015-06-17
  Filled 2015-06-17: qty 30

## 2015-06-17 MED ORDER — ACETAMINOPHEN 325 MG PO TABS: 650.0000 mg | ORAL_TABLET | ORAL | Status: DC | PRN

## 2015-06-17 MED ORDER — LACTATED RINGERS IV SOLN
500.0000 mL | INTRAVENOUS | Status: DC | PRN
  Administered 2015-06-17: 800 mL via INTRAVENOUS

## 2015-06-17 MED ORDER — OXYTOCIN 40 UNITS IN LACTATED RINGERS INFUSION - SIMPLE MED
2.5000 [IU]/h | INTRAVENOUS | Status: DC
  Filled 2015-06-17: qty 1000

## 2015-06-17 MED ORDER — DIPHENHYDRAMINE HCL 50 MG/ML IJ SOLN: 12.5000 mg | INTRAMUSCULAR | Status: DC | PRN

## 2015-06-17 MED ORDER — SENNOSIDES-DOCUSATE SODIUM 8.6-50 MG PO TABS
2.0000 | ORAL_TABLET | ORAL | Status: DC
  Administered 2015-06-17 – 2015-06-18 (×2): 2 via ORAL
  Filled 2015-06-17 (×2): qty 2

## 2015-06-17 MED ORDER — PENICILLIN G POTASSIUM 5000000 UNITS IJ SOLR
2.5000 10*6.[IU] | INTRAVENOUS | Status: DC
  Filled 2015-06-17 (×4): qty 2.5

## 2015-06-17 MED ORDER — OXYCODONE HCL 5 MG PO TABS
10.0000 mg | ORAL_TABLET | ORAL | Status: DC | PRN
  Administered 2015-06-18: 10 mg via ORAL
  Filled 2015-06-17: qty 2

## 2015-06-17 MED ORDER — PHENYLEPHRINE 40 MCG/ML (10ML) SYRINGE FOR IV PUSH (FOR BLOOD PRESSURE SUPPORT)
80.0000 ug | PREFILLED_SYRINGE | INTRAVENOUS | Status: DC | PRN
Start: 2015-06-17 — End: 2015-06-17
  Filled 2015-06-17: qty 5

## 2015-06-17 MED ORDER — PRENATAL MULTIVITAMIN CH
1.0000 | ORAL_TABLET | Freq: Every day | ORAL | Status: DC
  Administered 2015-06-18 – 2015-06-19 (×2): 1 via ORAL
  Filled 2015-06-17 (×2): qty 1

## 2015-06-17 MED ORDER — COCONUT OIL OIL: 1.0000 "application " | TOPICAL_OIL | Status: DC | PRN

## 2015-06-17 MED ORDER — SOD CITRATE-CITRIC ACID 500-334 MG/5ML PO SOLN
30.0000 mL | ORAL | Status: DC | PRN
  Administered 2015-06-17: 30 mL via ORAL
  Filled 2015-06-17: qty 15

## 2015-06-17 MED ORDER — BENZOCAINE-MENTHOL 20-0.5 % EX AERO
1.0000 "application " | INHALATION_SPRAY | CUTANEOUS | Status: DC | PRN
  Administered 2015-06-18: 1 via TOPICAL
  Filled 2015-06-17: qty 56

## 2015-06-17 MED ORDER — PENICILLIN G POTASSIUM 5000000 UNITS IJ SOLR
5.0000 10*6.[IU] | Freq: Once | INTRAMUSCULAR | Status: DC
Start: 2015-06-17 — End: 2015-06-17
  Filled 2015-06-17: qty 5

## 2015-06-17 MED ORDER — TERBUTALINE SULFATE 1 MG/ML IJ SOLN
0.2500 mg | Freq: Once | INTRAMUSCULAR | Status: DC | PRN
Start: 2015-06-17 — End: 2015-06-17
  Filled 2015-06-17: qty 1

## 2015-06-17 MED ORDER — OXYCODONE HCL 5 MG PO TABS: 5.0000 mg | ORAL_TABLET | ORAL | Status: DC | PRN

## 2015-06-17 MED ORDER — OXYCODONE-ACETAMINOPHEN 5-325 MG PO TABS: 2.0000 | ORAL_TABLET | ORAL | Status: DC | PRN

## 2015-06-17 MED ORDER — LACTATED RINGERS IV SOLN
INTRAVENOUS | Status: DC
  Administered 2015-06-17 (×2): via INTRAVENOUS

## 2015-06-17 MED ORDER — LACTATED RINGERS IV SOLN: 500.0000 mL | Freq: Once | INTRAVENOUS | Status: DC

## 2015-06-17 MED ORDER — ONDANSETRON HCL 4 MG/2ML IJ SOLN: 4.0000 mg | INTRAMUSCULAR | Status: DC | PRN

## 2015-06-17 MED ORDER — ONDANSETRON HCL 4 MG/2ML IJ SOLN: 4.0000 mg | Freq: Four times a day (QID) | INTRAMUSCULAR | Status: DC | PRN

## 2015-06-17 NOTE — Progress Notes (Signed)
Frank VB noted at perineum, QBL .

## 2015-06-17 NOTE — Progress Notes (Signed)
Rapid Response Note  S: Called by RN at 4:42PM concerned about brisk, abundant bleeding after foley placement.  O:BP 124/79 mmHg  Pulse 88  Temp(Src) 98.3 F (36.8 C) (Oral)  Resp 18  Ht 5\' 2"  (1.575 m)  Wt 178 lb (80.74 kg)  BMI 32.55 kg/m2 GEN: AOx3, moderate painful distress CV: RRR, no murmur appreciated PULM: CTAB, no r/r/w ABD: Gravida, tender to palpation in LLQ EXT: No CCE, no signs of DVT  A/P: 30 yo G1P0 admitted for IOL 2/2   -Cont routine monitor -Strict I/O -If additional blood loss consider POCT H/H check or CBC to monitor  -Cont MIVF after 1L bolus at 125 cc/hr -If changes in BP/HR or FHT please call MD immediately  Rejeana BrockKelly Aguilar, PGY-2, FM  I have seen and examined this patient and I agree with the above. Cam HaiSHAW, KIMBERLY CNM 9:23 PM 06/17/2015

## 2015-06-17 NOTE — Anesthesia Pain Management Evaluation Note (Signed)
  [facility] Pain Management Visit Note  Patient: Misty Allen, 30 y.o., female  "Hello I am a member of the anesthesia team at [facility]. We have an anesthesia team available at all times to provide care throughout the hospital, including epidural management and anesthesia for C-section. I don't know your plan for the delivery whether it a natural birth, water birth, IV sedation, nitrous supplementation, doula or epidural, but we want to meet your pain goals."   1.Was your pain managed to your expectations on prior hospitalizations?   No prior hospitalizations  2.What is your expectation for pain management during this hospitalization?     Undecided. Considering epidural  3.How can we help you reach that goal? Posible epidural  Record the patient's initial score and the patient's pain goal.   Pain: 0  Pain Goal: Undecided, will decide as labor progreses. I spoke to patient through the interpretor. The [facility] wants you to be able to say your pain was always managed very well.  Quatavious Rossa 06/17/2015

## 2015-06-17 NOTE — H&P (Signed)
Misty Allen is a 30 y.o. 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  Of note is that ultrasounds starting May 2017 show IUGR with growth <10%tile, prior to this all MFM ultrasounds show symmetrical appropriate growth and normal AFI.  Today pt presents for scheduled IOL.    Denies any sx and fetal movement present.  Maternal Medical History:  Reason for admission: IOL for IUGR  Fetal activity: Perceived fetal activity is normal.   Last perceived fetal movement was within the past hour.    Prenatal complications: IUGR.     OB History    Gravida Para Term Preterm AB TAB SAB Ectopic Multiple Living       History reviewed. No pertinent past medical history. History reviewed. No pertinent past surgical history. Family History: family history includes Diabetes in her mother. Social History:  reports that she has never smoked. She uses smokeless tobacco. She reports that she does not drink alcohol or use illicit drugs.   Prenatal Transfer Tool  Maternal Diabetes: No Genetic Screening: Declined Maternal Ultrasounds/Referrals: Abnormal:  Findings:   IUGR, Fetal Kidney Anomalies Fetal Ultrasounds or other Referrals:  Referred to Materal Fetal Medicine  Maternal Substance Abuse:  No Significant Maternal Medications:  None Significant Maternal Lab Results:  None Other Comments:  None  Review of Systems  Constitutional: Negative.   HENT: Negative.   Eyes: Negative.   Respiratory: Negative.   Cardiovascular: Negative.   Gastrointestinal: Negative.   Genitourinary: Negative.   Musculoskeletal: Negative.   Skin: Negative.   Neurological: Negative.   Endo/Heme/Allergies: Negative.   Psychiatric/Behavioral: Negative.       Blood pressure 118/74, pulse 80, temperature 98.3 F  (36.8 C), temperature source Oral, resp. rate 18, height  (1.575 m), weight 178 lb (80.74 kg). Maternal Exam:  Uterine Assessment: Contraction strength is mild.  Contraction frequency is irregular.   Abdomen: Patient reports no abdominal tenderness. Fetal presentation: vertex  Introitus: Normal vulva. Normal vagina.    Physical Exam  Constitutional: She is oriented to person, place, and time. She appears well-developed and well-nourished.  HENT:  Head: Normocephalic and atraumatic.  Eyes: Conjunctivae and EOM are normal. Pupils are equal, round, and reactive to light.  Neck: Normal range of motion. Neck supple.  Cardiovascular: Normal rate, regular rhythm and normal heart sounds.   Respiratory: Effort normal and breath sounds normal.  GI: Soft. Bowel sounds are normal.  Musculoskeletal: Normal range of motion.  Neurological: She is oriented to person, place, and time.  Skin: Skin is warm and dry.    Prenatal labs: ABO, Rh: --/--/O POS (06/02 0820) Antibody: NEG (06/02 0820) Rubella: 7.35 (11/09 1153) RPR: Non Reactive (06/02 0820)  HBsAg: NEGATIVE (11/09 1153)  HIV: NONREACTIVE (03/15 1631)  GBS: Positive (05/16 0000)   Assessment/Plan: IUP@38 .3wks IUGR Fetal kidney concern  Begin w/ cervical ripening using cytotec, and then progress to FB/Pit as needed  [name] 06/17/2015, 2:34 PM   CNM attestation:  I have seen and examined this patient; I agree with above documentation in the resident's note.   Misty Allen is a 30 y.o. G1P1001 here for IOL due to IUGR. Of note, there is also fetal left renal agenesis vs pelvic kidney  PE: BP 121/70 mmHg  Pulse 79  Temp(Src) 98.2 F (36.8 C) (Oral)  Resp 18  Ht 5\' 2"  (1.575 m)  Wt 80.74 kg (178 lb)  BMI 32.55 kg/m2  SpO2 100%  Breastfeeding? Unknown Gen: calm comfortable, NAD Resp: normal effort, no distress Abd: gravid EFM: +accels, no decels  ROS, labs, PMH reviewed  Plan: IUP@38 .3wks IUGR  <10% Fetal kidney concern Cx unfavorable  Will being w/ cytotec for cx ripening, and then progress to FB/Pit Anticipate SVD  [name] CNM 06/17/2015, 9:23 PM

## 2015-06-17 NOTE — Progress Notes (Signed)
Patient ID: Misty Allen, female   DOB: 02/27/1985, 30 y.o.   MRN: [id number]  Pt doing well; s/p cytotec x 2 doses FHR 150s, 10x10 accels, no decels, occ mi variables Irreg mild ctx  Cx 1/50/-2; foley bulb easily placed and inflated w/ 60cc fluid by nursing Once placed, frank blood noted to be in foley tubing  IUP@38 .3wks IUGR Fetal kidney concern Unfavorable cx  Will continue to monitor bleeding, maternal VS, and FHR Family updated  Cam HaiSHAW, Misty Allen CNM 06/17/2015 6:41 PM

## 2015-06-18 NOTE — Progress Notes (Signed)
Interpreter [id number] used for teaching. Will continue to monitor pt. 

## 2015-06-18 NOTE — Progress Notes (Signed)
Post Partum Day 1 Subjective:  Misty Allen is a 30 y.o. G1P1001 4386w3d s/p VAVD. Pregnancy was complicated by IUGR and absent fetal left kidney vs pelvic kidney. No acute events overnight.  Pt denies problems with ambulating, voiding, nausea, or vomiting. Pain is well controlled.  She has not had flatus.  Lochia Minimal.  Plan for birth control is Depo-Provera.  Method of Feeding: breast.  Objective: Blood pressure 125/69, pulse 74, temperature 98.1 F (36.7 C), temperature source Oral, resp. rate 18, height 5\' 2"  (1.575 m), weight 80.74 kg (178 lb), SpO2 100 %, unknown if currently breastfeeding.  Physical Exam:  General: alert, cooperative and no distress Chest: normal WOB Heart: Regular rate Abdomen: +BS, soft DVT Evaluation: no evidence of DVT seen on physical exam. Extremities: mild edema   Recent Labs  06/17/15 0820  HGB 12.5  HCT 36.1    Assessment/Plan:  ASSESSMENT: Misty Allen is a 30 y.o. G1P1001 6886w3d s/p VAVD. She is GBS positive but PCN prophylaxis was not given in time, so she will stay 2 days.  Plan for discharge tomorrow Continue routine PP care Breastfeeding support PRN  CNM attestation Post Partum Day #1  Misty Allen is a 30 y.o. G1P1001 s/p SVD.  Pt denies problems with ambulating, voiding or po intake. Pain is well controlled.  Plan for birth control is Depo-Provera.  Method of Feeding: breast  PE:  BP 125/69 mmHg  Pulse 74  Temp(Src) 98.1 F (36.7 C) (Oral)  Resp 18  Ht 5\' 2"  (1.575 m)  Wt 80.74 kg (178 lb)  BMI 32.55 kg/m2  SpO2 100%  Breastfeeding? Unknown Fundus firm  Plan for discharge: 06/19/15  Cam HaiSHAW, Jerome Otter, CNM 9:14 AM  06/18/2015

## 2015-06-18 NOTE — Lactation Note (Signed)
This note was copied from a baby's chart. Lactation Consultation Note  Patient Name: Girl Lindwood QuaWafaa Agustin ZOXWR'UToday's Date: 06/18/2015 Reason for consult: Initial assessment;Infant < 6lbs Infant is 7321 hours old & [facility]. Baby was born at 995w3d and weighed 5+2.7# at birth. Baby has latch scores of 4, 5, 7 and dad reports BF is going better than in the beginning. Previous BF was for 60 mins. Mom was trying to latch baby in cradle hold on her left breast when LC entered. [facility] and he came back inside room to help interpret. Baby was crying and would not latch or suckle. After calming baby, she laid skin-to-skin with mom and did not show feeding cues. Mom & dad made aware of O/P services, breastfeeding support groups, community resources, and our phone # for post-discharge questions. Encouraged mom to use cross-cradle hold at next feeding to offer more support- showed mom & dad how to do this hold & dad stated he understood. Encouraged mom to feed on cue at least 8-12 x in 24 hours; nurse has instructed parents to feed baby at least every 3 hours due to baby's size. Teach back done for hand expression & drops were [facility] & mom doing hand expression on mom's right breast but none from her left breast. Mom also had a manual pump in her room & dad stated they had been instructed to use manual pump before BF. Mom's right nipple is on the flatter side & her left nipple is everted but has less breast tissue. Encouraged parents to call for nurse or lactation at next feeding to assess latch. Mom reports no questions at this time.  Maternal Data Has patient been taught Hand Expression?: Yes Does the patient have breastfeeding experience prior to this delivery?: No  Feeding Feeding Type: Breast Fed Length of feed: 5 min  LATCH Score/Interventions Latch: Too sleepy or reluctant, no latch achieved, no sucking elicited. Intervention(s): Teach feeding  cues;Skin to skin  Audible Swallowing: None Intervention(s): Hand expression  Type of Nipple: Everted at rest and after stimulation  Comfort (Breast/Nipple): Soft / non-tender     Hold (Positioning): Assistance needed to correctly position infant at breast and maintain latch. Intervention(s): Support Pillows;Position options  LATCH Score: 5  Lactation Tools Discussed/Used     Consult Status Consult Status: Follow-up Date: 06/19/15 Follow-up type: In-patient    Oneal GroutLaura C Lazariah Savard 06/18/2015, 4:52 PM

## 2015-06-19 DIAGNOSIS — Z8759 Personal history of other complications of pregnancy, childbirth and the puerperium: Secondary | ICD-10-CM

## 2015-06-19 MED ORDER — IBUPROFEN 600 MG PO TABS: 600.0000 mg | ORAL_TABLET | Freq: Four times a day (QID) | ORAL | Status: DC

## 2015-06-19 NOTE — Discharge Summary (Signed)
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 yo G1P1001 at [redacted]w[redacted]d was admitted to the hospital 06/17/2015 for induction of labor.  Indication for induction: IUGR. Over the hour prior to delivery the fetus had a fetal bradycardic episode which resolved with position oxygen.  The baby continued to have variables and IUPC placed and  amnioinfusion started, and finally delivered by vacuum.  Membrane Rupture Time/Date: 6:17 PM ,06/17/2015   Intrapartum Procedures: Episiotomy: None [1]                                         Lacerations:  Periurethral [8]  Patient had delivery of a Viable infant.  Information for the patient's newborn:  Misty, Allen [[id number]]  Delivery Method: Vaginal, Spontaneous Delivery (Filed from Delivery Summary)   06/17/2015  Details of delivery can be found in separate delivery note.  Patient had a routine postpartum course. Patient is discharged home 06/19/2015.   Physical exam  Filed Vitals:   06/18/15 0217 06/18/15 0604 06/18/15 1641 06/19/15 0620  BP: 116/63 125/69 122/68 113/66  Pulse: 72 74 76 75  Temp: 98.3 F (36.8 C) 98.1 F (36.7 C) 98.2 F (36.8 C) 98.3 F (36.8 C)  TempSrc: Oral Oral Oral Oral  Resp: Height:      Weight:      SpO2:       General: alert, cooperative and no distress Lochia: appropriate Uterine Fundus: firm Incision: N/A DVT Evaluation: No evidence of DVT seen on physical exam. Labs: Lab Results  Component Value Date   WBC 8.7 06/17/2015   HGB 12.5 06/17/2015   HCT 36.1 06/17/2015   MCV 84.9 06/17/2015   PLT 230 06/17/2015   CMP Latest Ref Rng 11/10/2014  Glucose 65 - 99 mg/dL 782(N)  BUN 6 - 20 mg/dL 12  Creatinine 5.62 - 1.30 mg/dL 8.65  Sodium 784 - 696 mmol/L 134(L)  Potassium 3.5 - 5.1 mmol/L 4.1  Chloride 101 - 111 mmol/L 107  CO2 22 - 32 mmol/L 24  Calcium 8.9 - 10.3 mg/dL 2.9(B)    Discharge instruction: per After Visit Summary and "Baby and Me Booklet".  After visit meds:    Medication List    TAKE these medications  acetaminophen 500 MG tablet  Commonly known as:  TYLENOL  Take 500 mg by mouth every 6 (six) hours as needed for moderate pain.     ibuprofen 600 MG tablet  Commonly known as:  ADVIL,MOTRIN  Take 1 tablet (600 mg total) by mouth every 6 (six) hours.     PRENATAL COMPLETE 14-0.4 MG Tabs  Take 1 tablet by mouth daily.        Diet: routine diet  Activity: Advance as tolerated. Pelvic rest for 6 weeks.   Outpatient follow up:6 weeks  Postpartum contraception: Depo Provera (not given)  Newborn Data: Live born female  Birth Weight: 5 lb 2.7 oz (2345 g) APGAR: 8, 9  Baby Feeding: Breast Disposition:home with mother   06/19/2015 Misty Herculesaye T Gonfa, MD  OB fellow attestation I have seen and examined this patient and agree with above documentation in the resident's note.   Misty Allen is a 30 y.o. G1P1001 s/p NSVD.   Pain is well controlled.  Plan for birth control is Depo-Provera.  Method of Feeding: breast  PE:  BP 113/66 mmHg  Pulse 75  Temp(Src) 98.3 F (36.8 C) (Oral)  Resp 18  Ht 5\' 2"  (1.575 m)  Wt 178 lb (80.74 kg)  BMI 32.55 kg/m2  SpO2 100%  Breastfeeding? Unknown Gen: well  appearing Heart: reg rate Lungs: normal WOB Fundus firm Ext: soft, no pain, no edema   Recent Labs  06/17/15 0820  HGB 12.5  HCT 36.1   Plan: discharge today - postpartum care discussed - f/u clinic in 6 weeks for postpartum visit   Misty FlakeKimberly Niles Artemis Koller, MD 10:14 AM

## 2015-06-19 NOTE — Progress Notes (Signed)
Mom speaks arabic. FOB signed an interpreter consent. FOB and mom refused  to have an interpreter due to different dialects within their culture.

## 2015-06-19 NOTE — Discharge Instructions (Signed)

## 2015-06-19 NOTE — Lactation Note (Signed)
This note was copied from a baby's chart. Lactation Consultation Note  Reviewed hand expression with parents. Assisted them with positioning baby at the breast. Able to achieve a deep latch and with breast compression many swallows were heard.  [name] ate for 15 minutes on the left breast and 10 on the right. She was sound asleep after the feeding. RN reported that the last feeding also went well and that she was supplemented with 5 ml of formula and slept 3 hours.  FOB observed that she look more relaxed.  Explained to parents that if she was continuing to cue after the feeding she could receive expressed BM or formula.  Instructed mother to post-pump for 10 minutes 6 x in 24 hrs. Also reminded parents that it was ok to feed [name] anytime she was giving feeding cues.    Patient Name: Misty Allen Today's Date: 06/19/2015     Maternal Data    Feeding Feeding Type: Breast Fed Length of feed: 25 min  LATCH Score/Interventions Latch: Repeated attempts needed to sustain latch, nipple held in mouth throughout feeding, stimulation needed to elicit sucking reflex. Intervention(s): Skin to skin Intervention(s): Adjust position;Assist with latch;Breast massage;Breast compression  Audible Swallowing: A few with stimulation Intervention(s): Hand expression Intervention(s): Alternate breast massage  Type of Nipple: Everted at rest and after stimulation (central dimple)  Comfort (Breast/Nipple): Soft / non-tender     Hold (Positioning): Assistance needed to correctly position infant at breast and maintain latch.  LATCH Score: 7  Lactation Tools Discussed/Used Date initiated:: 06/19/15   Consult Status Consult Status: Follow-up Date: 06/20/15 Follow-up type: In-patient    Soyla DryerJoseph, Rashon Rezek 06/19/2015, 1:52 PM

## 2015-06-20 ENCOUNTER — Ambulatory Visit: Payer: Self-pay

## 2015-06-20 NOTE — Lactation Note (Signed)
This note was copied from a baby's chart. Lactation Consultation Note Plan pre and post weights at next feeding.  Patient Name: Misty Allen'UToday's Date: 06/20/2015 Reason for consult: Follow-up assessment   Maternal Data    Feeding Feeding Type: Breast Fed Length of feed: 15 min (see note)  LATCH Score/Interventions Latch: Grasps breast easily, tongue down, lips flanged, rhythmical sucking. Intervention(s): Skin to skin Intervention(s): Assist with latch;Breast massage  Audible Swallowing: A few with stimulation  Type of Nipple: Everted at rest and after stimulation  Comfort (Breast/Nipple): Soft / non-tender     Hold (Positioning): Assistance needed to correctly position infant at breast and maintain latch.  LATCH Score: 8  Lactation Tools Discussed/Used     Consult Status      Soyla DryerJoseph, Amely Voorheis 06/20/2015, 9:50 AM

## 2015-06-20 NOTE — Lactation Note (Signed)
This note was copied from a baby's chart. Lactation Consultation Note Called for assistance in BF.when arrived FOB stated had finished. BF for 10 min. Mom has short shaft nipples. Reverse pressure to areola to assist in areola everting more. Not helpful. Shells given to wear in bra. Bra is to small and hard in the cups. Discussed proper support in bras during breastfeeding periods. FOB states baby latches onto nipple and when comes off nipple is elongated. Discussed w/FOB regarding supplementing w/formula d/t weight loss and SGA. Gave supplementing information sheet. FOB stated didn't baby to have bottle. Discussed options of supplemental feeding. Syring/finger feeding. Recommended that for colostrum feeding, then SNS for formula feeding supplementing. In agreement. Taught set up and cleaning after feeding. Baby wouldn't suckle on breast or finger feeding. Mom shown how to use DEBP & how to disassemble, clean, & reassemble parts. Mom knows to pump q3h for 15-20 min. Encouraged hand expression after pumping. Explained newborn behavior, especially for SGA baby's. Encouraged STS. Discussed hand expression, supplementing colostrum first then supplement. FOB translates for mom, teach back. FOB states its a lot of stuff to do. Explained once moms milk comes in hopefully will not have to do all of this. [facility].  Noted baby has tiny mouth, tongue cups low when cries. Suck training w/gloved finger, had poor coordination at this time. Will assess for the next feeding.  Patient Name: Misty Lindwood QuaWafaa Jeffries WUJWJ'XToday's Date: 06/20/2015 Reason for consult: Follow-up assessment;Infant weight loss;Infant < 6lbs   Maternal Data    Feeding Feeding Type: Formula Length of feed: 10 min  LATCH Score/Interventions Latch: Repeated attempts needed to sustain latch, nipple held in mouth throughout feeding, stimulation needed to elicit sucking reflex. Intervention(s): Skin to skin;Teach  feeding cues;Waking techniques Intervention(s): Adjust position;Assist with latch;Breast massage;Breast compression  Intervention(s): Skin to skin;Hand expression Intervention(s): Alternate breast massage  Type of Nipple: Everted at rest and after stimulation  Comfort (Breast/Nipple): Soft / non-tender     Hold (Positioning): Assistance needed to correctly position infant at breast and maintain latch. Intervention(s): Support Pillows;Position options;Skin to skin;Breastfeeding basics reviewed     Lactation Tools Discussed/Used Tools: Shells;Nipple Shields;Pump;Supplemental Nutrition System Nipple shield size: 20 Shell Type: Inverted Breast pump type: Double-Electric Breast Pump Pump Review: Setup, frequency, and cleaning;Milk Storage Initiated by:: Peri JeffersonL. Thoms Barthelemy RN Date initiated:: 06/20/15   Consult Status Consult Status: Follow-up Date: 06/20/15 Follow-up type: In-patient    Jolanda Mccann, Diamond NickelLAURA G 06/20/2015, 1:26 AM

## 2015-06-20 NOTE — Lactation Note (Signed)
This note was copied from a baby's chart. Lactation Consultation Note Pre and post weights showed a transfer of 21 ml.  RN was able to get the baby attached to the left breast and she transferred 16 ml. They she was positioned on the right breast. She did not transfer anything but continued to root so she was offered finger feeding and a bottle of breast milk but would not feed with these methods.  she was repostioned and transferred 5 ml.  She continued to give cues so the bottle was offered again without success. Attempted to latch her back to the breast and she was too tired disorganized. She finally latched to the left breast with a #24 NS and suckled briefly. The shield was full of milk when she detached.  Plan is to feed on cue and supplement with a syringe SNS at the breast so that she gets the supplement before she is too tired. Follow-up tomorrow.  Patient Name: Misty Lindwood QuaWafaa Vanduzer VWUJW'JToday's Date: 06/20/2015 Reason for consult: Follow-up assessment   Maternal Data    Feeding Feeding Type: Breast Fed Length of feed:  (see note)  LATCH Score/Interventions Latch: Repeated attempts needed to sustain latch, nipple held in mouth throughout feeding, stimulation needed to elicit sucking reflex.  Audible Swallowing: A few with stimulation  Type of Nipple: Everted at rest and after stimulation  Comfort (Breast/Nipple): Soft / non-tender     Hold (Positioning): Assistance needed to correctly position infant at breast and maintain latch.  LATCH Score: 7  Lactation Tools Discussed/Used     Consult Status Consult Status: Follow-up Date: 06/21/15 Follow-up type: In-patient    Soyla DryerJoseph, Darnise Montag 06/20/2015, 3:19 PM

## 2015-06-21 ENCOUNTER — Ambulatory Visit: Payer: Self-pay

## 2015-06-21 NOTE — Lactation Note (Addendum)
This note was copied from a baby's chart. Lactation Consultation Note  Patient Name: Girl Lindwood QuaWafaa [employer] JYNWG'NToday's Date: 06/21/2015 Reason for consult: Follow-up assessment;Infant weight loss;Infant < 6lbs   Pump rental paperwork given with instructions to call when papers ready for pump rental. Care discussed with Dr. Margo AyeHall and Perlie Gold. [place] RN and plan was made to d/c infant home with Ped f/u tomorrow morning. [facility]   Maternal Data Formula Feeding for Exclusion: No Does the patient have breastfeeding experience prior to this delivery?: No  Feeding Feeding Type: Breast Fed Length of feed: 45 min  LATCH Score/Interventions Latch: Grasps breast easily, tongue down, lips flanged, rhythmical sucking. Intervention(s): Skin to skin;Teach feeding cues;Waking techniques Intervention(s): Adjust position;Assist with latch;Breast massage;Breast compression  Audible Swallowing: Spontaneous and intermittent Intervention(s): Skin to skin;Hand expression;Alternate breast massage  Type of Nipple: Everted at rest and after stimulation  Comfort (Breast/Nipple): Soft / non-tender     Hold (Positioning): Assistance needed to correctly position infant at breast and maintain latch. Intervention(s): Breastfeeding basics reviewed;Support Pillows;Position options;Skin to skin  LATCH Score: 9  Lactation Tools Discussed/Used Tools: 52F feeding tube / Syringe Breast pump type: Double-Electric Breast Pump WIC Program: No (Plans to apply) Pump Review: Setup, frequency, and cleaning;Milk Storage   Consult Status Consult Status: Follow-up Date: 06/27/15 Follow-up type: Out-patient    Silas FloodSharon S Argil Mahl 06/21/2015, 12:34 PM

## 2015-06-21 NOTE — Lactation Note (Signed)
This note was copied from a baby's chart. Lactation Consultation Note  Patient Name: Misty Allen [id number] Date: 06/21/2015 Reason for consult: Follow-up assessment;Infant weight loss;Infant < 6lbs   Follow up with mom and 38 hour old infant. Infant with 10 BF for 10-60 minutes, EBM/Formula via 5 Fr feeding tube at breast x 8 of 3-12 cc, 7 voids and 3 stools in 24 hours preceding assessment. Infant weight 4 lb 12. 7 oz which is decrease of 0.5 oz in last 24 hours.   Infant was BF when I went into room, she was noted to have SNS attached and had taken 4 cc through SNS. Infant was fussy, mom burped her and then latched her to left breast in cross cradle hold with SNS. Infant noted to have multiple swallows and softened breast with feeding. She did not take more through SNS. Mom did well with positioning, stimulating infant and massaging breasts. Infant fell asleep on her own ans was content post feed.   Mom pumped this morning with manual pump and received 70 cc EBM. Mom's breasts soften post feed without s/s engorgement.   Mom is on Medicaid and has not applied to [facility], FOB was given [facility] phone # and he is to call to apply. Discussed 2 week pump rental with FOB. He declined pump rental although we talked about supply and demand and protecting milk supply. Showed him how to use hand pump and double manual set up with pump kit. He then came to me 10 minutes later and told me that the double manual set up did not work and he wanted to rent a DEBP.   Reviewed engorgement prevention/treatment with parents. Reviewed I/O with parents. Infant with f/u with Ped tomorrow. Infant with F/U LC appt on 6/12.  Parents have written plan from yesterday and was enc to continue plan and follow up with [facility] on 6/12. Enc mom to continue pumping at least 4 x a day and continue supplementation.         Maternal Data Formula Feeding for Exclusion: No Does the patient have breastfeeding experience prior to this  delivery?: No  Feeding Feeding Type: Breast Fed Length of feed: 45 min  LATCH Score/Interventions Latch: Grasps breast easily, tongue down, lips flanged, rhythmical sucking. Intervention(s): Skin to skin;Teach feeding cues;Waking techniques Intervention(s): Adjust position;Assist with latch;Breast massage;Breast compression  Audible Swallowing: Spontaneous and intermittent Intervention(s): Skin to skin;Hand expression;Alternate breast massage  Type of Nipple: Everted at rest and after stimulation  Comfort (Breast/Nipple): Soft / non-tender     Hold (Positioning): Assistance needed to correctly position infant at breast and maintain latch. Intervention(s): Breastfeeding basics reviewed;Support Pillows;Position options;Skin to skin  LATCH Score: 9  Lactation Tools Discussed/Used Tools: 101F feeding tube / Syringe Breast pump type: Double-Electric Breast Pump WIC Program: No (Plans to apply) Pump Review: Setup, frequency, and cleaning;Milk Storage   Consult Status Consult Status: Follow-up Date: 06/27/15 Follow-up type: Out-patient    Debby Freiberg Dace Denn 06/21/2015, 11:31 AM

## 2015-06-27 ENCOUNTER — Ambulatory Visit (HOSPITAL_COMMUNITY): Admission: RE | Admit: 2015-06-27 | Payer: Medicaid Other | Source: Ambulatory Visit

## 2015-07-15 ENCOUNTER — Ambulatory Visit: Payer: Medicaid Other | Admitting: Obstetrics & Gynecology

## 2015-07-15 ENCOUNTER — Encounter (HOSPITAL_COMMUNITY): Payer: Self-pay | Admitting: Emergency Medicine

## 2015-07-15 ENCOUNTER — Ambulatory Visit (HOSPITAL_COMMUNITY)
Admission: EM | Admit: 2015-07-15 | Discharge: 2015-07-15 | Disposition: A | Discharge status: Home / Self Care | Payer: Medicaid Other | Attending: Emergency Medicine | Admitting: Emergency Medicine

## 2015-07-15 DIAGNOSIS — K5900 Constipation, unspecified: Secondary | ICD-10-CM

## 2015-07-15 DIAGNOSIS — L906 Striae atrophicae: Secondary | ICD-10-CM | POA: Diagnosis not present

## 2015-07-15 LAB — POCT URINALYSIS DIP (DEVICE)
BILIRUBIN URINE: NEGATIVE
Glucose, UA: NEGATIVE mg/dL
Ketones, ur: NEGATIVE mg/dL
Leukocytes, UA: NEGATIVE
NITRITE: NEGATIVE
PH: 5.5 (ref 5.0–8.0)
PROTEIN: NEGATIVE mg/dL
Specific Gravity, Urine: 1.025 (ref 1.005–1.030)
Urobilinogen, UA: 0.2 mg/dL (ref 0.0–1.0)

## 2015-07-15 MED ORDER — DOCUSATE SODIUM 100 MG PO CAPS: 100.0000 mg | ORAL_CAPSULE | Freq: Two times a day (BID) | ORAL | Status: DC

## 2015-07-15 NOTE — ED Notes (Signed)
Via husband how is interpreting for pt Pt is here for a f/u... Reports they will be traveling Monday out of the country and just want to make sure she is ok after giving vaginal birth  Reports she [facility]   Also c/o stretch marks on bilateral legs/inner thighs onset x1 moth after giving birth  She is A&O x4... NAD

## 2015-07-15 NOTE — ED Provider Notes (Signed)
CSN: [id number]     Arrival date & time 07/15/15  1728 History   First MD Initiated Contact with Patient 07/15/15 1757     Chief Complaint  Patient presents with  . Follow-up  . Leg Problem   (Consider location/radiation/quality/duration/timing/severity/associated sxs/prior Treatment) HPI  She is a 30 year old woman here with her husband for postpartum follow-up. Her husband acted as Equities traderinterpreter. She missed her postpartum follow-up with her OB and [place].  She had a vacuum-assisted vaginal delivery and June 2. She did have a right periurethral laceration that did not require suturing. She had an unremarkable postpartum course. Today, she is concerned about stretch marks on her thighs and buttock area. She continues to have some vaginal discharge and bleeding, but states this is slowing down. She is urinating without difficulty, but states she does have a very forceful stream. She isn't passing gas, but no bowel movement in 2 days. She is breast and bottle feeding. She also reports some discomfort in the pelvic area.  History reviewed. No pertinent past medical history. History reviewed. No pertinent past surgical history. Family History  Problem Relation Age of Onset  . Diabetes Mother    Social History  Substance Use Topics  . Smoking status: Never Smoker   . Smokeless tobacco: Current User     Comment: Hookah- stopped when pregnant  . Alcohol Use: No   OB History    Gravida Para Term Preterm AB TAB SAB Ectopic Multiple Living   1 1 1  0 0 0 0 0 0 1     Review of Systems As in history of present illness Allergies  Bee venom  Home Medications   Prior to Admission medications   Medication Sig Start Date End Date Taking? Authorizing Provider  acetaminophen (TYLENOL) 500 MG tablet Take 500 mg by mouth every 6 (six) hours as needed for moderate pain.    Historical Provider, MD  docusate sodium (COLACE) 100 MG capsule Take 1 capsule (100 mg  total) by mouth every 12 (twelve) hours. 07/15/15   Charm RingsErin J Adali Pennings, MD  ibuprofen (ADVIL,MOTRIN) 600 MG tablet Take 1 tablet (600 mg total) by mouth every 6 (six) hours. 06/19/15   Almon Herculesaye T Gonfa, MD  Prenatal Vit-Fe Fumarate-FA (PRENATAL COMPLETE) 14-0.4 MG TABS Take 1 tablet by mouth daily. Patient taking differently: Take 1 tablet by mouth daily.  01/05/15   Catalina AntiguaPeggy Constant, MD   Meds Ordered and Administered this Visit  Medications - No data to display  BP 116/67 mmHg  Pulse 71  Temp(Src) 97.6 F (36.4 C) (Oral)  Resp 16  SpO2 99%  Breastfeeding? Yes No data found.   Physical Exam  Constitutional: She is oriented to person, place, and time. She appears well-developed and well-nourished. No distress.  Cardiovascular: Normal rate.   Pulmonary/Chest: Effort normal.  Genitourinary: Rectal exam shows no external hemorrhoid. There is no rash or injury on the right labia. There is no rash or injury on the left labia. Cervix exhibits no discharge and no friability. No bleeding in the vagina. No foreign body around the vagina. No vaginal discharge found.  Neurological: She is alert and oriented to person, place, and time.  Skin:  Stretch marks on thighs, hips, and buttocks.    ED Course  Procedures (including critical care time)  Labs Review Labs Reviewed  POCT URINALYSIS DIP (DEVICE) - Abnormal; Notable for the following:    Hgb urine dipstick SMALL (*)    All  other components within normal limits    Imaging Review No results found.    MDM   1. Postpartum exam   2. Stretch marks   3. Constipation, unspecified constipation type    Normal postpartum course with some mild constipation. Reassurance provided that the stretch marks are normal. Discussed that they will fade, but never completely disappear. Colace for constipation. We did discuss their travel plans. I recommended TED hose and frequent movement during the flight to prevent blood clots.    Charm RingsErin J Lorrin Bodner, MD 07/15/15  [phone]

## 2015-07-15 NOTE — Discharge Instructions (Signed)
Everything is healing as it should after having a baby. The bleeding and discharge will continue to slow down over the next 2-4 weeks. Please take Colace twice a day to help with bowel movements. It is very common to have constipation in the first few months after giving birth. The marks on her skin or stretch marks. These will fade over time, but will likely never completely go away. The discomfort you feel in your lower belly will gradually improve over the next few weeks.  It is okay for you to travel. Please get TED hose at the drug store and wear these when you are on the plane. This is to help prevent blood clots. It is also important that you move around every 1-2 hours during the flight to prevent blood clots.

## 2016-01-06 ENCOUNTER — Other Ambulatory Visit: Payer: Self-pay | Admitting: Obstetrics and Gynecology

## 2016-10-05 IMAGING — US US MFM OB FOLLOW-UP
1 series · 14 of 28 positions shown · non-contrast
Comparison: none

[Series 1: us mfm ob follow-up · 60 acquisitions, 14 frames shown]
[im 3/60]
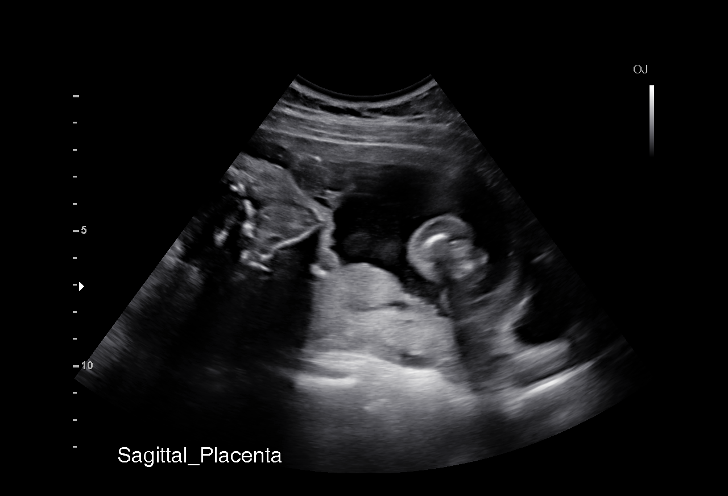
[im 7/60]
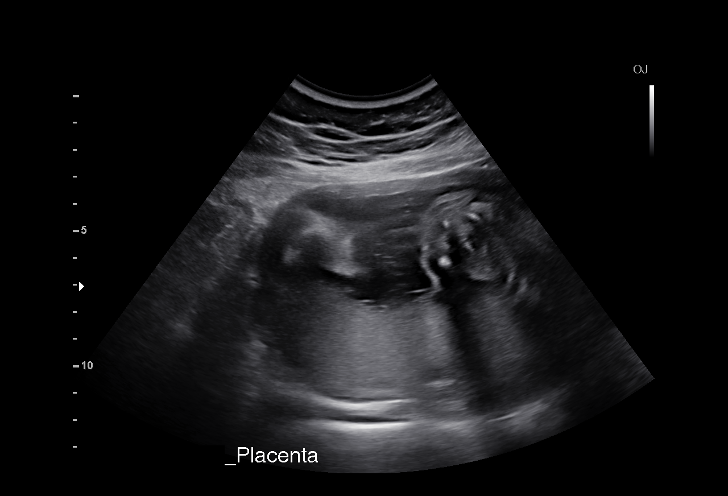
[im 11/60]
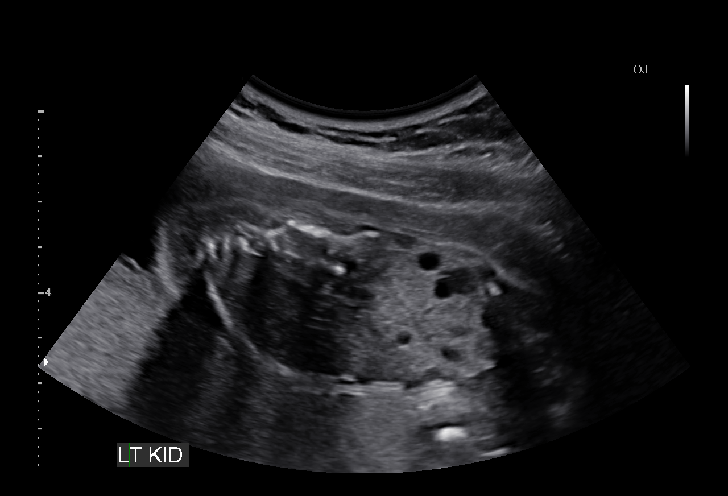
[im 16/60]
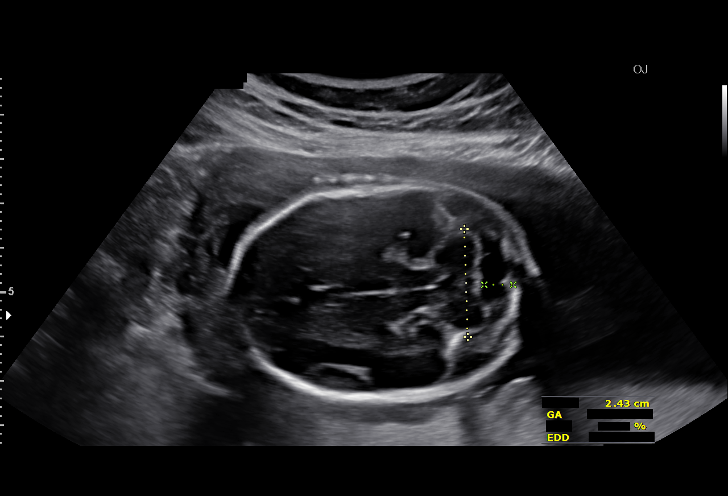
[im 20/60]
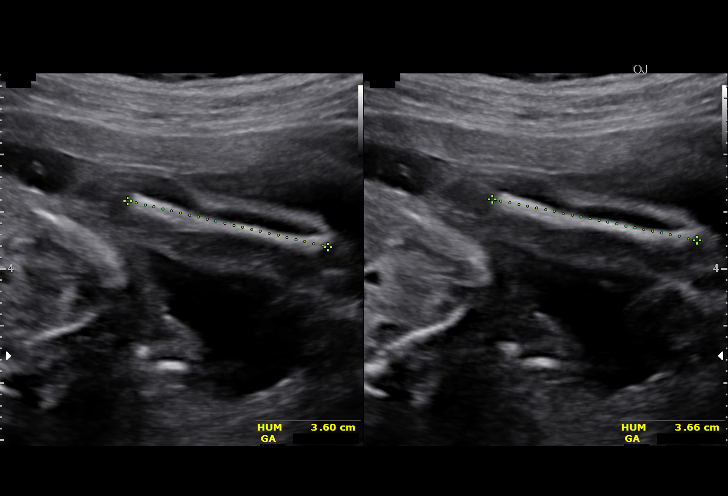
[im 25/60]
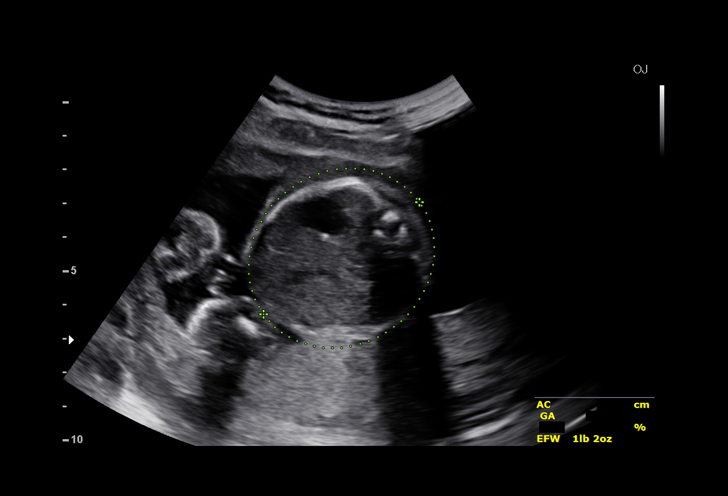
[im 29/60]
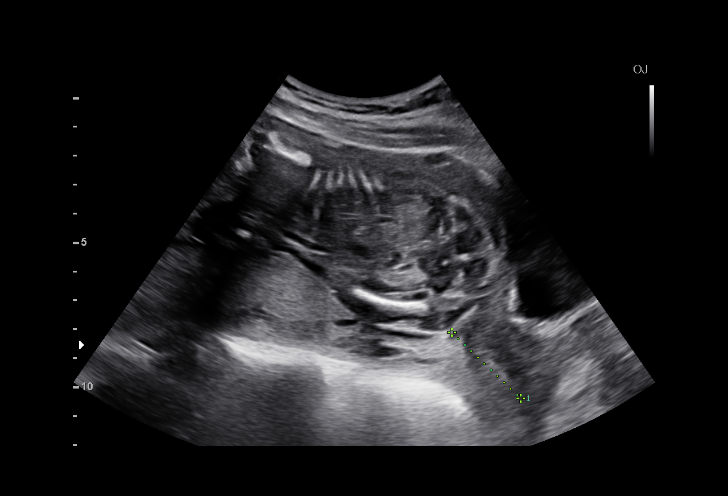
[im 33/60]
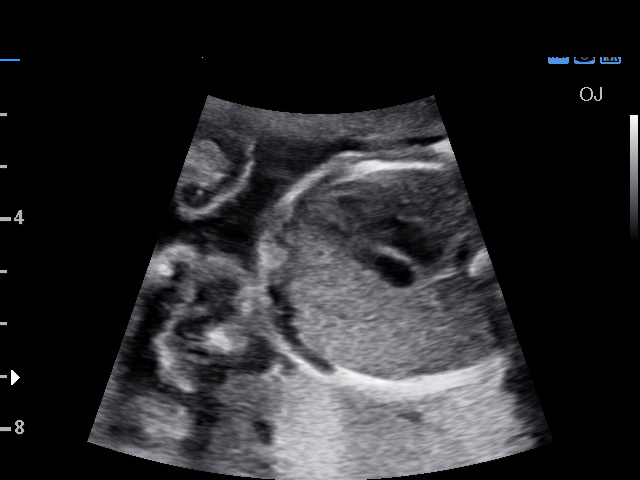
[im 38/60]
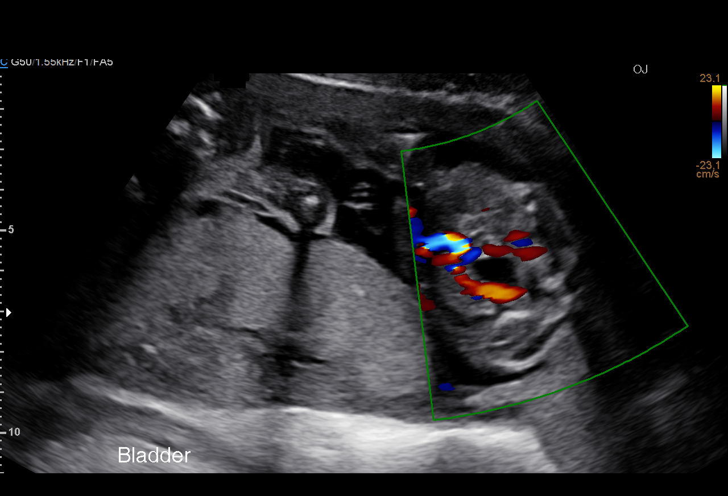
[im 42/60]
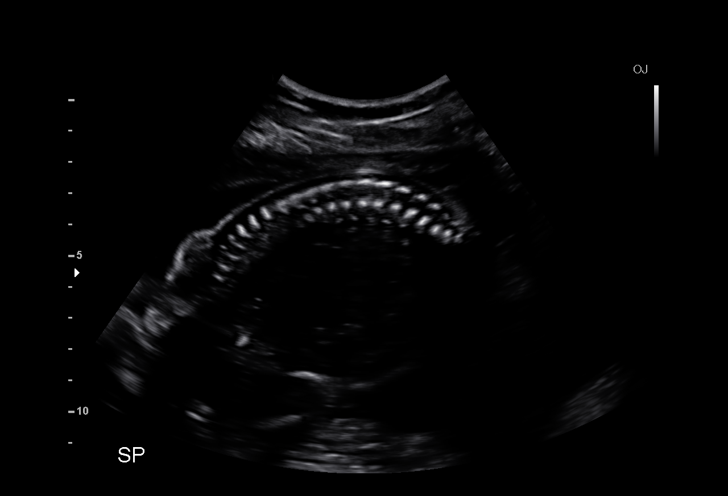
[im 46/60]
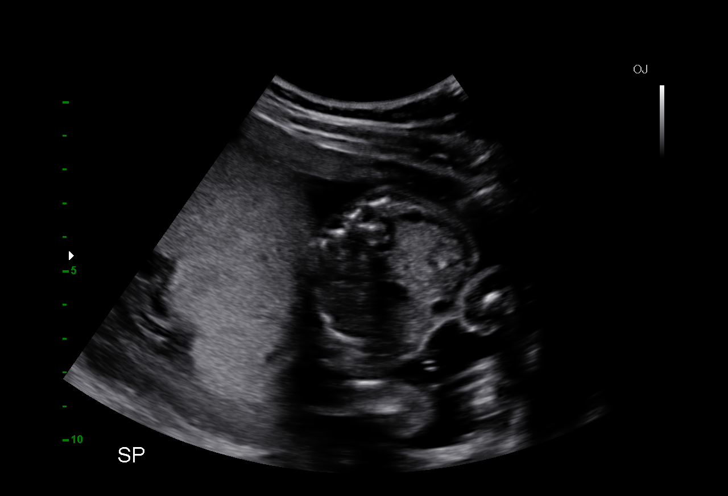
[im 51/60]
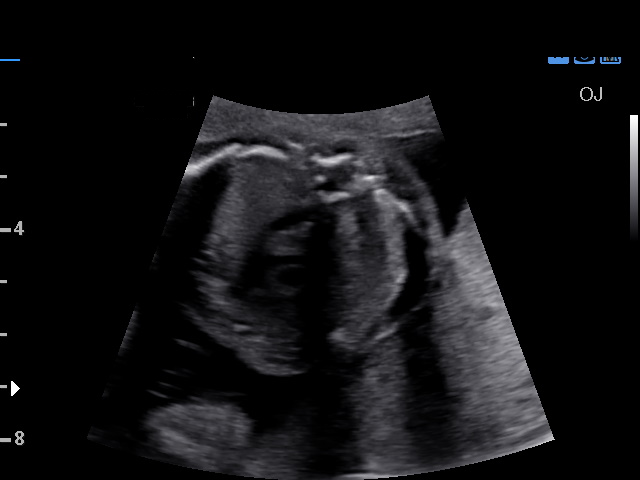
[im 55/60]
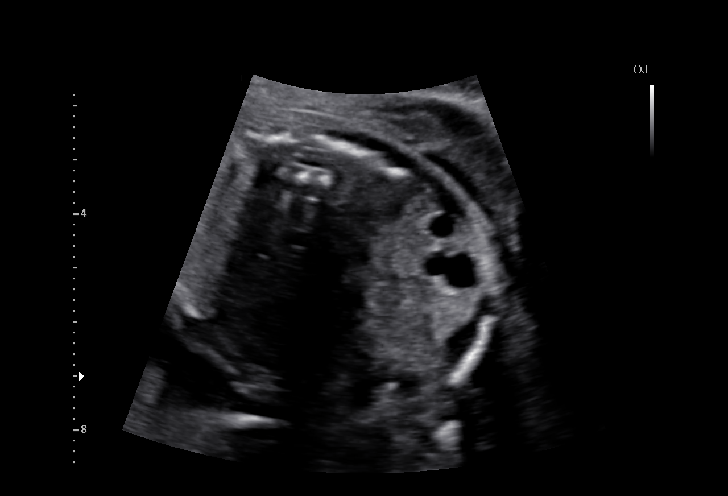
[im 60/60]
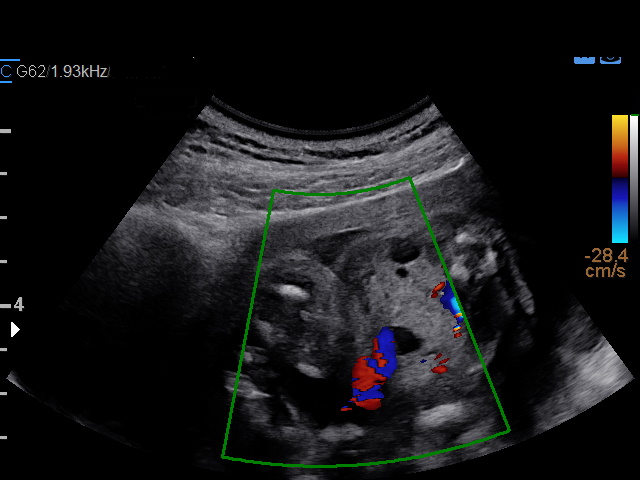

[14 of 28 positions shown; findings below may reference images not displayed]

Hospital Clinic-
Faculty Physician
OB/Gyn Clinic
[REDACTED]

1  ELIDOR LINDER            [id number]      [id number]     [id number]
Indications

23 weeks gestation of pregnancy
Fetal renal anomaly, unspecified fetus
([facility])-declined testing
OB History

Gravidity:    1
Fetal Evaluation

Num Of Fetuses:     1
Fetal Heart         167
Rate(bpm):
Cardiac Activity:   Observed
Presentation:       Breech
Placenta:           Posterior, above cervical os
P. Cord Insertion:  Previously Visualized

Amniotic Fluid
AFI FV:      Subjectively within normal limits
Larg Pckt:    3.1  cm
Biometry

BPD:      47.2  mm     G. Age:  20w 2d                  CI:         66.17  %    70 - 86
FL/HC:       21.9  %    19.2 -
HC:      186.1  mm     G. Age:  20w 6d        < 3  %    HC/AC:       1.09       1.05 -
AC:      170.2  mm     G. Age:  22w 0d         12  %    FL/BPD:      86.4  %    71 - 87
FL:       40.8  mm     G. Age:  23w 2d         41  %    FL/AC:       24.0  %    20 - 24
HUM:      36.7  mm     G. Age:  22w 6d         35  %
CER:      24.3  mm     G. Age:  22w 2d         37  %
CM:        6.5  mm

Est. FW:     491   gm     1 lb 1 oz     32  %
Gestational Age

U/S Today:     21w 4d                                        EDD:    07/09/15
Best:          23w 1d     Det. By:  Early Ultrasound         EDD:    06/28/15
(11/10/14)
Anatomy

Cranium:          Appears normal         Aortic Arch:      Appears normal
Fetal Cavum:      Appears normal         Ductal Arch:      Previously seen
Ventricles:       Appears normal         Diaphragm:        Appears normal
Choroid Plexus:   Previously seen        Stomach:          Appears normal, left
sided
Cerebellum:       Appears normal         Abdomen:          Appears normal
Posterior Fossa:  Appears normal         Abdominal Wall:   Previously seen
Nuchal Fold:      Previously seen        Cord Vessels:     Appears normal (3
vessel cord)
Face:             Orbits and profile     Kidneys:          Multicystic
previously seen
dysplastic, left
Lips:             Previously seen        Bladder:          Appears normal
Fetal Thoracic:   Appears normal         Spine:            Appears normal
Heart:            Appears normal         Upper             Previously seen
(4CH, axis, and        Extremities:
situs)
RVOT:             Appears normal         Lower             Previously seen
Extremities:
LVOT:             Previously seen

Other:  Fetus appears to be a female. Heels and 5th digit prev. visualized.
Nasal bone prev. visualized.
Cervix Uterus Adnexa

Cervix
Length:            3.3  cm.
Normal appearance by transabdominal scan.
Impression

SIUP at 23+ weeks
Left multicystic, dysplastic kidney in fetal pelvis; right kidney
normal
All other interval fetal anatomy was seen and appeared
normal
Normal amniotic fluid volume
Appropriate interval growth with EFW at the 32rd %tile

Pt declined option of meeting with pediatric urologist
prenatally.
Recommendations

Follow-up ultrasound in 4 weeks to reassess kidneys

## 2017-01-10 IMAGING — US US MFM FETAL BPP W/O NON-STRESS
1 series · 14 of 28 positions shown · non-contrast
Comparison: none

Hospital Clinic-
Faculty Physician
OB/Gyn Clinic
[REDACTED]

1  JIMI GHATTAS            [id number]      [id number]     [id number]
2  JIMI GHATTAS            [id number]      [id number]     [id number]
3  JIMI GHATTAS            [id number]      [id number]     [id number]
Indications
37 weeks gestation of pregnancy
Fetal renal anomaly, unspecified fetus
(unilateral cystic, dysplastic kidney, difficult
to visualize) - declined testing
Maternal care for known or suspected poor
fetal growth, third trimester, not applicable or
unspecified
OB History
Gravidity:    1
Fetal Evaluation
Num Of Fetuses:     1
Fetal Heart         153
Rate(bpm):
Cardiac Activity:   Observed
Presentation:       Cephalic
Placenta:           Anterior, above cervical os
Amniotic Fluid
AFI FV:      Subjectively within normal limits
AFI Sum(cm)     %Tile       Largest Pocket(cm)
12.23           40
RUQ(cm)       RLQ(cm)       LUQ(cm)        LLQ(cm)
2.23
Biophysical Evaluation
Amniotic F.V:   Pocket => 2 cm two         F. Tone:        Observed
planes
F. Movement:    Observed                   Score:          [DATE]
F. Breathing:   Observed
Biometry
BPD:      82.1  mm     G. Age:  33w 0d          0  %    CI:        71.45   %    70 - 86
FL/HC:      21.5   %    20.8 -
HC:      309.3  mm     G. Age:  34w 4d        < 3  %    HC/AC:      1.05        0.92 -
AC:      294.3  mm     G. Age:  33w 3d        < 3  %    FL/BPD:     80.9   %    71 - 87
FL:       66.4  mm     G. Age:  34w 1d        < 3  %    FL/AC:      22.6   %    20 - 24
HUM:      55.5  mm     G. Age:  32w 2d        < 5  %
Est. FW:    6630  gm           5 lb   < 10  %
Gestational Age
U/S Today:     33w 6d                                        EDD:   07/20/15
Best:          37w 0d     Det. By:  Early Ultrasound         EDD:   06/28/15
(11/10/14)
Anatomy
Ventricles:            Appears normal         Kidneys:                Absent left kidney
Stomach:               Appears normal, left   Bladder:                Appears normal
sided
Doppler - Fetal Vessels
Umbilical Artery
S/D     %tile     RI              PI              PSV    ADFV    RDFV
(cm/s)
2.02       29     0.5             0.7             40.95      No      No
Impression
INDICATION: 29 yr old G1P0 at 00w3d with fetal growth
restriction and absent left fetal kidney for fetal growth, BPP,
and Doppler studies.

[Series 1: us mfm fetal bpp w/o non-stress · 58 acquisitions, 14 frames shown]
[im 3/58]
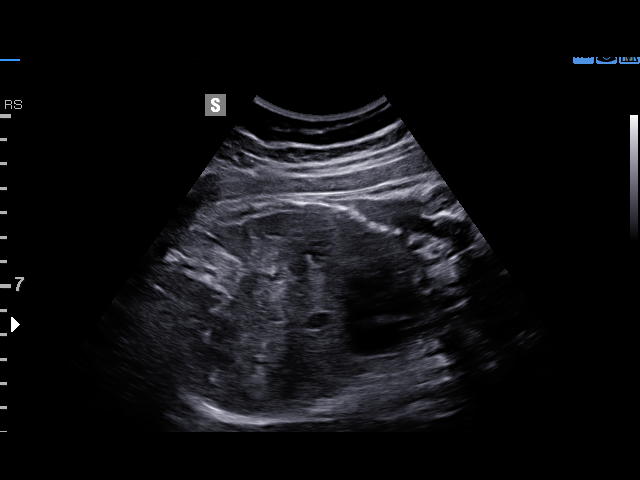
[im 7/58]
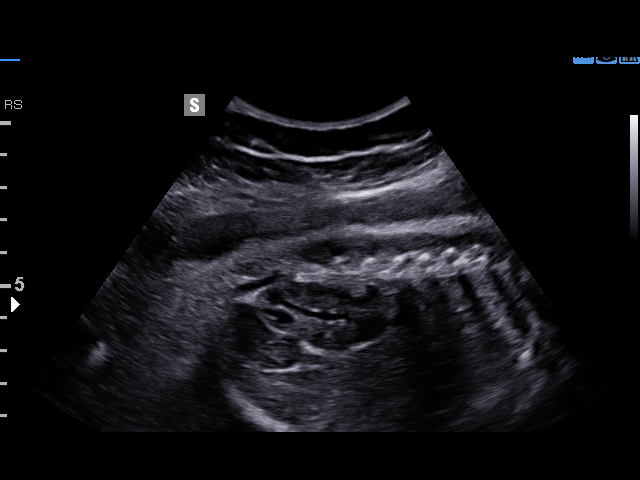
[im 11/58]
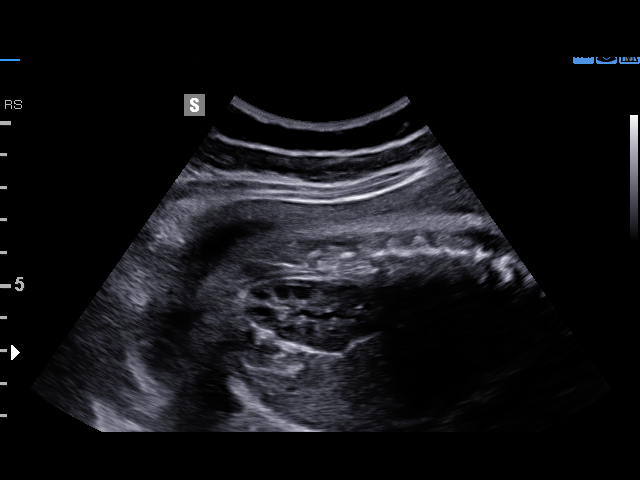
[im 15/58]
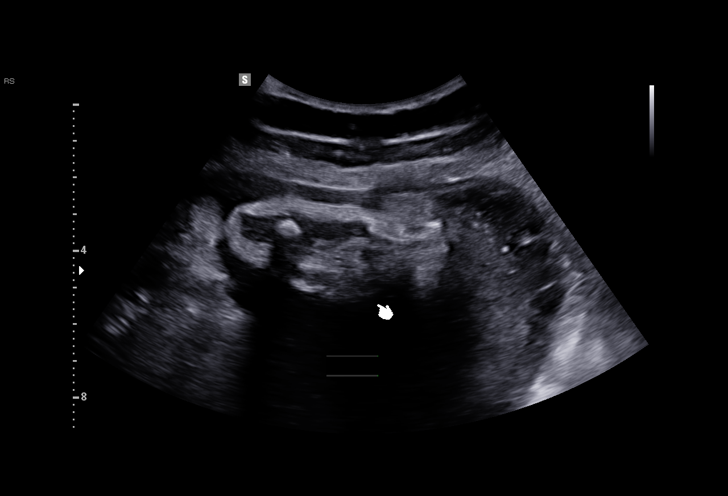
[im 20/58]
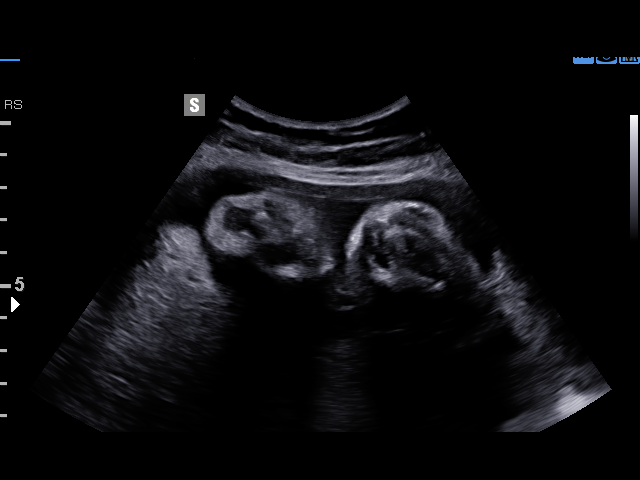
[im 24/58]
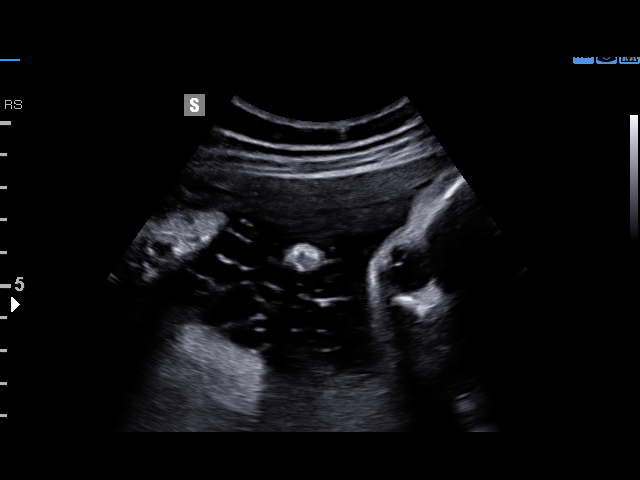
[im 28/58]
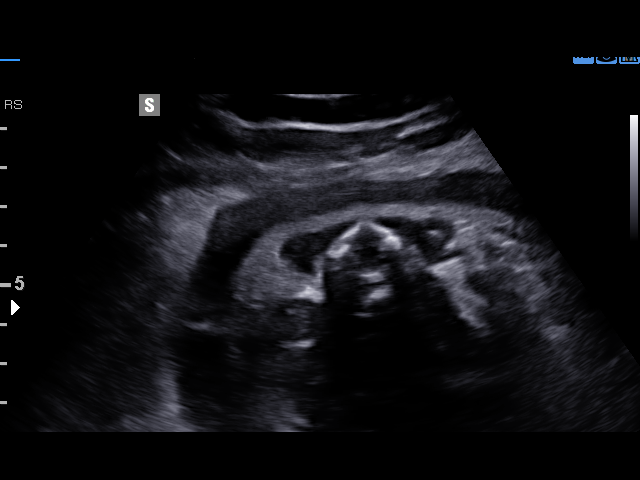
[im 32/58]
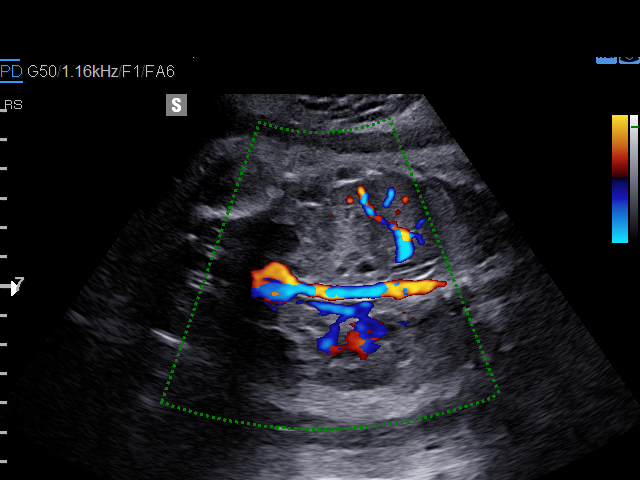
[im 36/58]
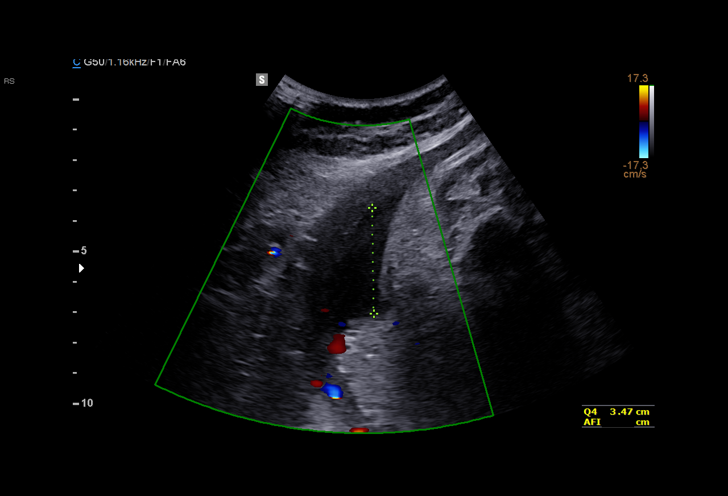
[im 41/58]
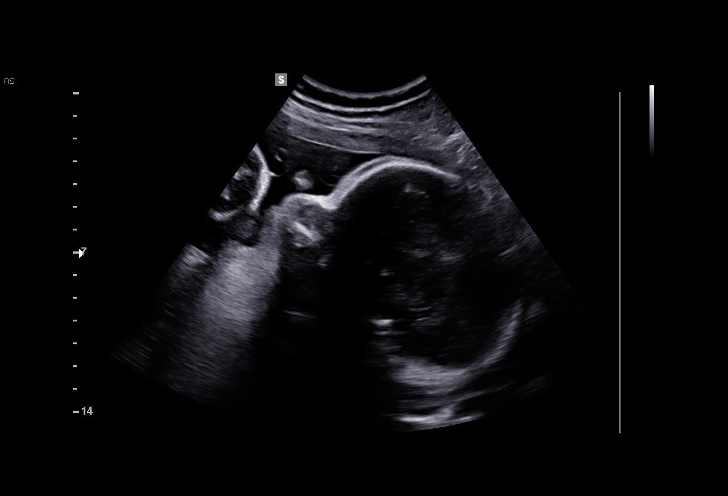
[im 45/58]
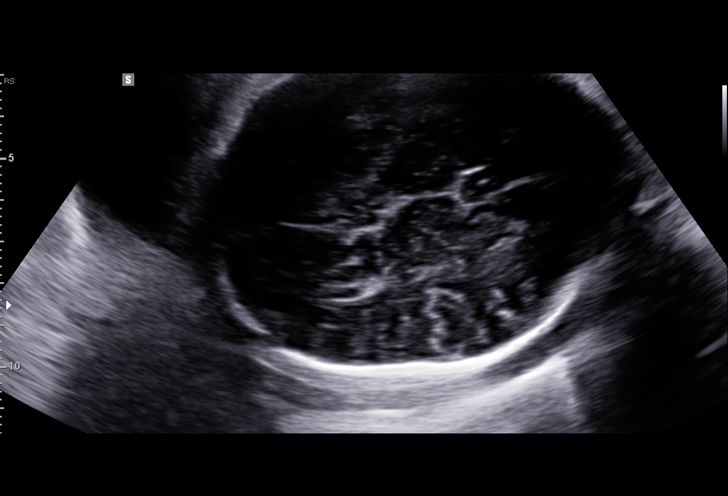
[im 49/58]
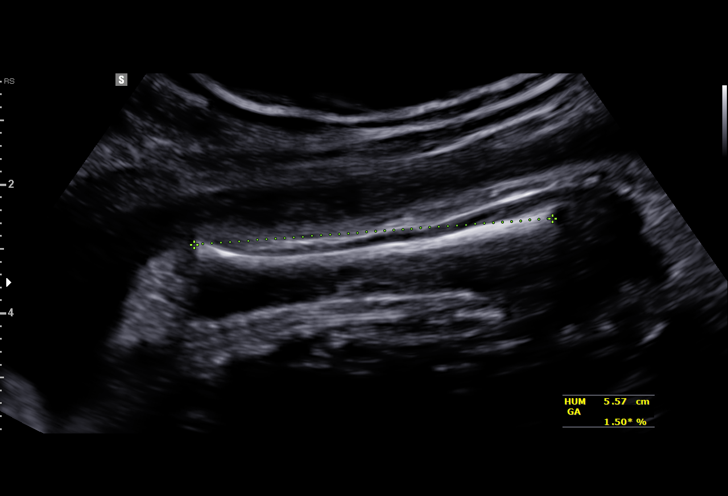
[im 53/58]
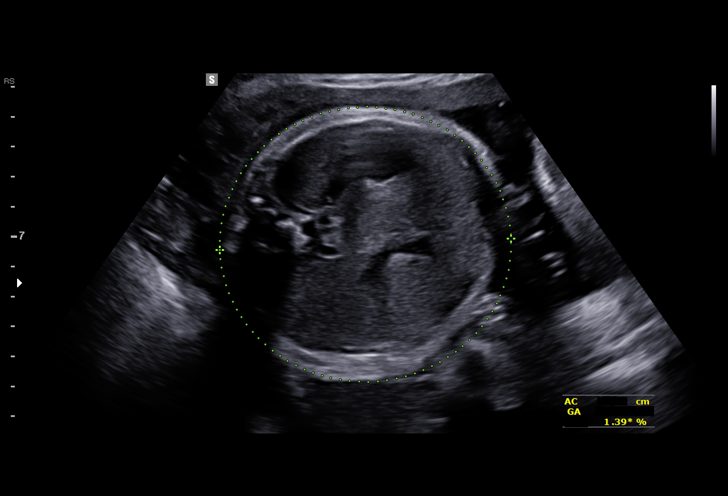
[im 58/58]
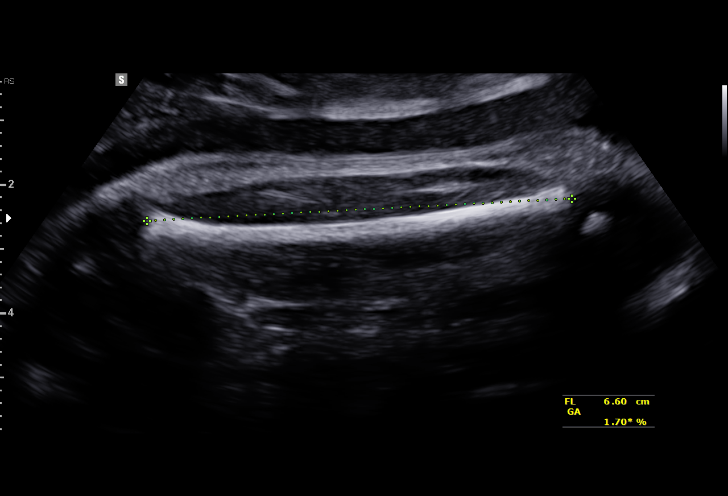

[14 of 28 positions shown; findings below may reference images not displayed]

FINDINGS: 1. Single intrauterine pregnancy.
2. Estimated fetal weight is in the <10th%.
3. Anterior placenta without evidence of previa.
4. Normal amniotic fluid index.
5. Again the left kidney is not seen; the right kidney is normal.
6. The remainder of the limited anatomy survey is normal.
7. Normal umbilical artery Doppler studies.
8. Normal biophysical profile of [DATE].
Recommendations

1. Fetal growth restriction:
- previously counseled
- adequate interval fetal growth
- normal Doppler studies, AFI, and BPP
- recommend continue antenatal testing and weekly Doppler
studies
- if growth restriction remains uncomplicated (normal AFI,
normal Doppler studies, reassuring fetal testing) recommend
delivery at 38-39 weeks or sooner if clinically indicated
- recommend fetal [place]
2. Fetus with suspected absent left kidney vs pelvic kidney:
- previously counseled
- inform Pediatrics at delivery

## 2017-01-17 IMAGING — US US MFM UA CORD DOPPLER
1 series · 14 of 14 positions shown · non-contrast
Comparison: none

[Series 1: us mfm ua cord doppler · 14 acquisitions, 14 frames shown]
[im 1/14]
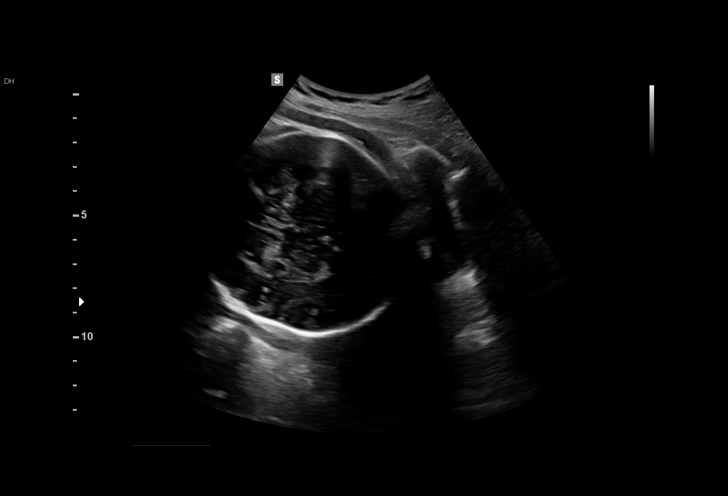
[im 2/14]
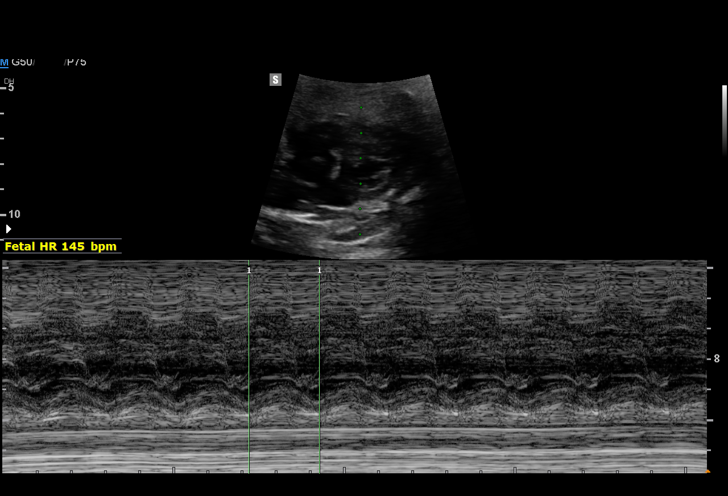
[im 3/14]
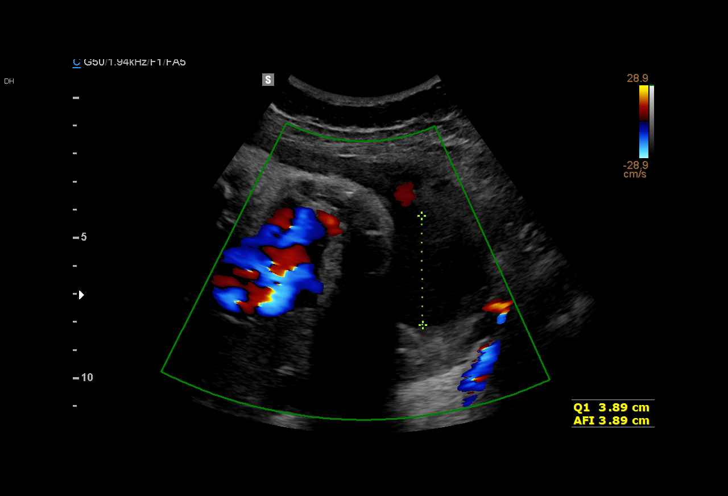
[im 4/14]
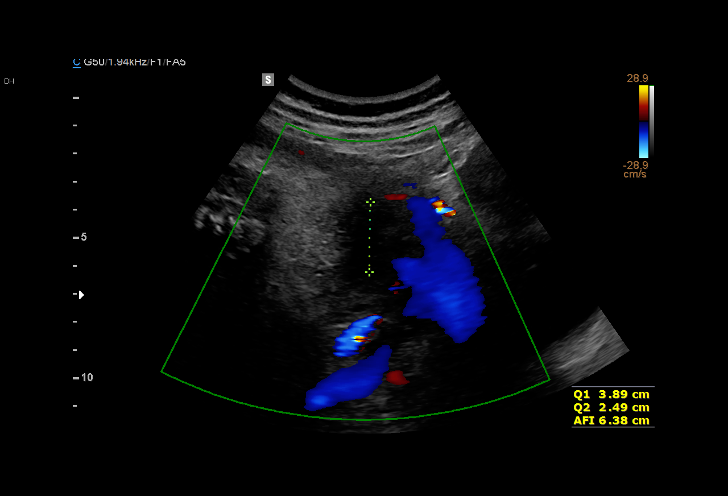
[im 5/14]
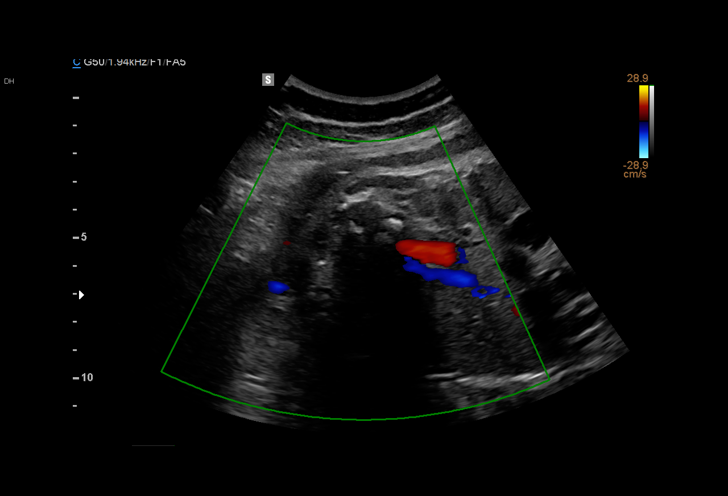
[im 6/14]
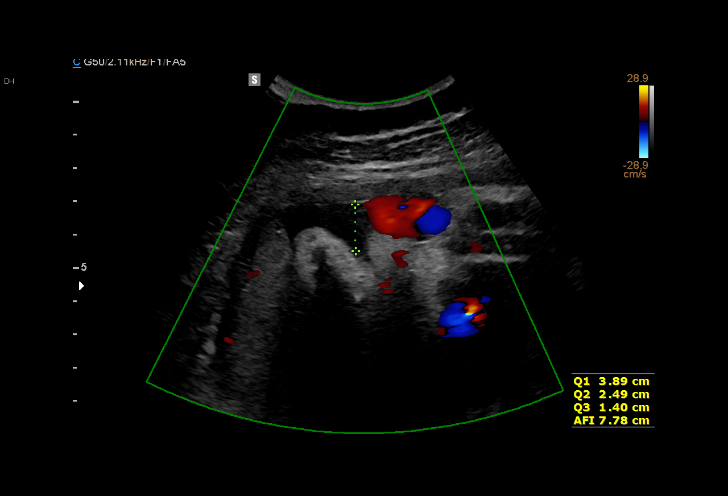
[im 7/14]
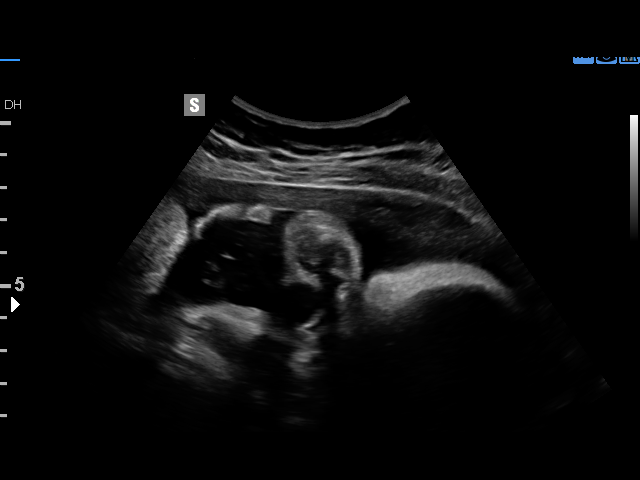
[im 8/14]
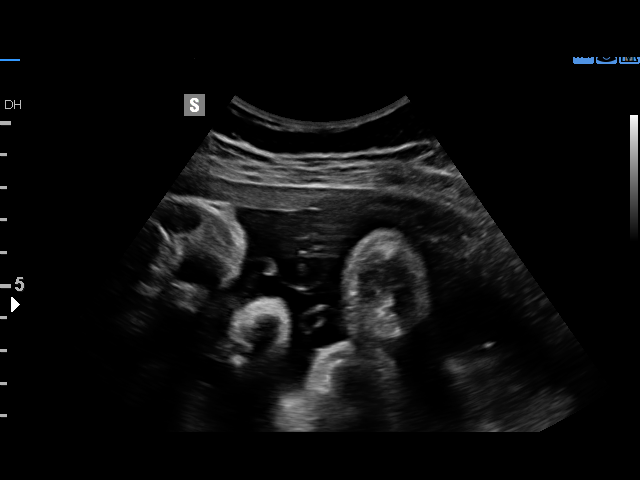
[im 9/14]
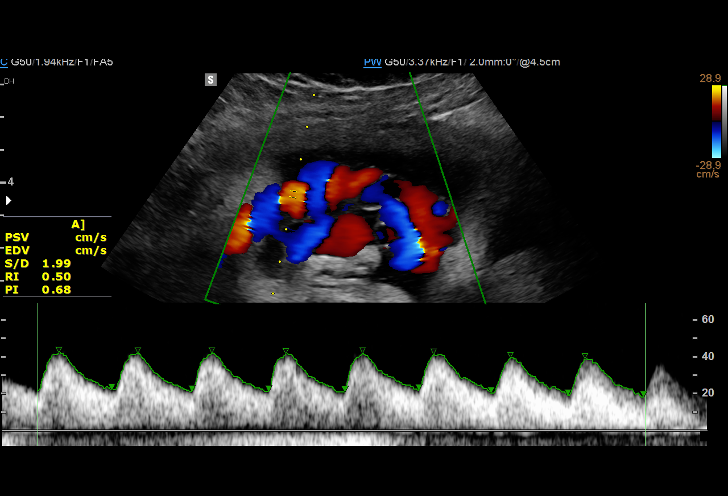
[im 10/14]
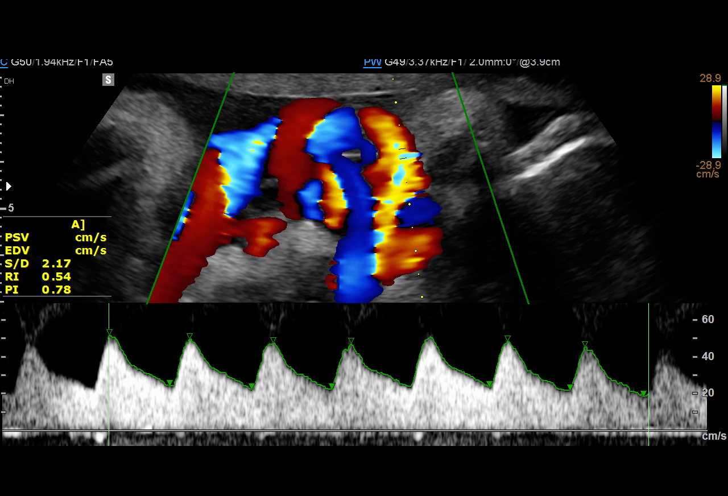
[im 11/14]
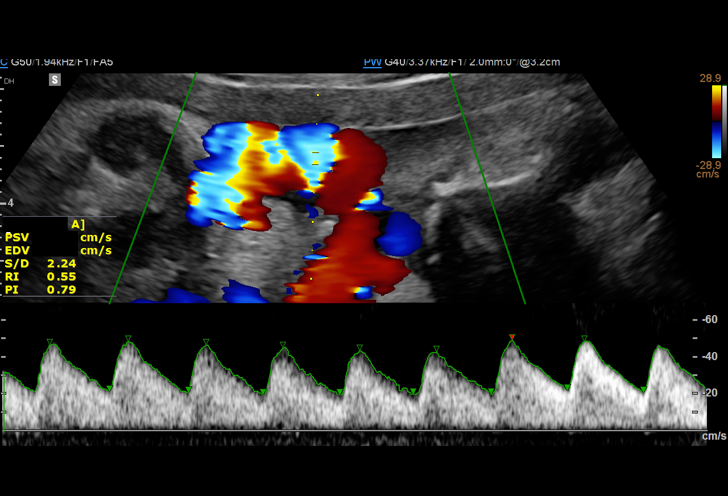
[im 12/14]
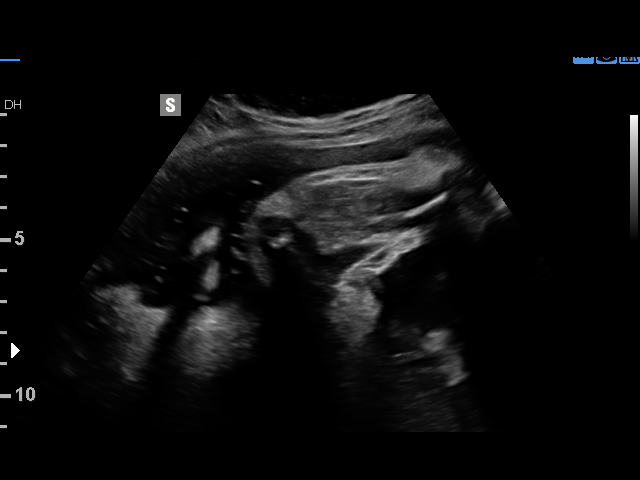
[im 13/14]
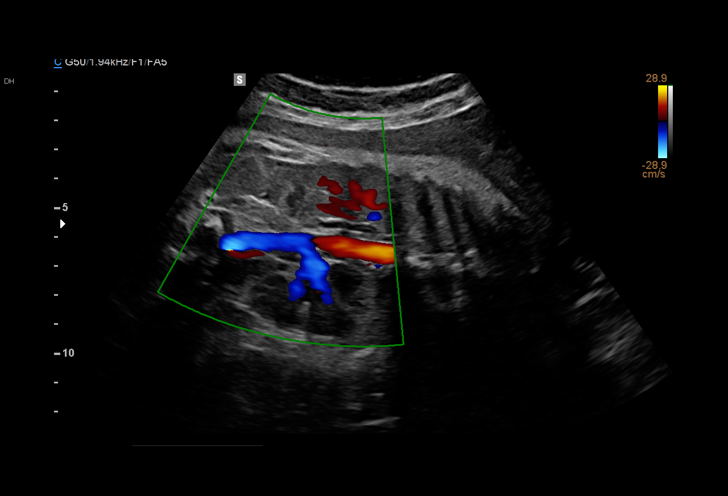
[im 14/14]
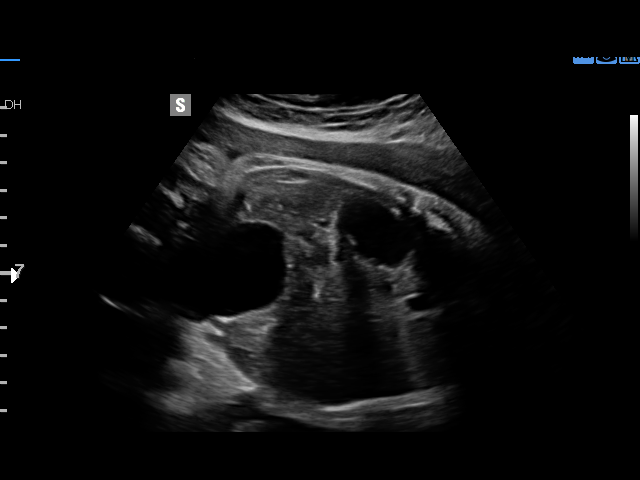

[14 of 14 positions shown; findings below may reference images not displayed]

Hospital Clinic-
Faculty Physician
OB/Gyn Clinic
[REDACTED]

1  ABDELRAHMAN RAPALO            [id number]      [id number]     [id number]
2  ABDELRAHMAN RAPALO            [id number]      [id number]     [id number]
Indications

38 weeks gestation of pregnancy
Fetal renal anomaly, unspecified fetus (?
absent left kidney)
Maternal care for known or suspected poor
fetal growth, third trimester, not applicable or
unspecified
OB History

Gravidity:    1
Fetal Evaluation

Num Of Fetuses:     1
Fetal Heart         145
Rate(bpm):
Cardiac Activity:   Observed
Presentation:       Cephalic
Placenta:           Anterior, above cervical os
P. Cord Insertion:  Previously Visualized

Amniotic Fluid
AFI FV:      Subjectively within normal limits
AFI Sum(cm)     %Tile       Largest Pocket(cm)
7.78            9

RUQ(cm)                     LUQ(cm)        LLQ(cm)
3.89
Biophysical Evaluation

Amniotic F.V:   Pocket => 2 cm two         F. Tone:        Observed
planes
F. Movement:    Observed                   Score:          [DATE]
F. Breathing:   Observed
Gestational Age

Best:          38w 0d     Det. By:  Early Ultrasound         EDD:   06/28/15
(11/10/14)
Doppler - Fetal Vessels

Umbilical Artery
S/D     %tile     RI              PI              PSV    ADFV    RDFV
(cm/s)
2.18       43   0.54             0.85             49.83      No      No

Impression

Single IUP at 38w 0d
Suspected fetal growth restriction, absent left kidney
BPP [DATE]
UA Doppler studies: normal for gestational age (no AEDF or
REDF noted)
Normal amniotic fluid volume
Recommendations

Plan scheduled induction on 06/17/2015

## 2017-04-16 ENCOUNTER — Encounter: Payer: Self-pay | Admitting: *Deleted

## 2017-09-17 ENCOUNTER — Other Ambulatory Visit (HOSPITAL_COMMUNITY)
Admission: RE | Admit: 2017-09-17 | Discharge: 2017-09-17 | Disposition: A | Discharge status: Home / Self Care | Payer: Medicaid Other | Source: Ambulatory Visit | Attending: Advanced Practice Midwife | Admitting: Advanced Practice Midwife

## 2017-09-17 ENCOUNTER — Encounter: Payer: Self-pay | Admitting: Advanced Practice Midwife

## 2017-09-17 ENCOUNTER — Ambulatory Visit: Payer: Medicaid Other | Admitting: Clinical

## 2017-09-17 ENCOUNTER — Ambulatory Visit (INDEPENDENT_AMBULATORY_CARE_PROVIDER_SITE_OTHER): Payer: Medicaid Other | Admitting: Advanced Practice Midwife

## 2017-09-17 DIAGNOSIS — Z3481 Encounter for supervision of other normal pregnancy, first trimester: Secondary | ICD-10-CM

## 2017-09-17 DIAGNOSIS — Z3480 Encounter for supervision of other normal pregnancy, unspecified trimester: Secondary | ICD-10-CM | POA: Insufficient documentation

## 2017-09-17 DIAGNOSIS — Z3A10 10 weeks gestation of pregnancy: Secondary | ICD-10-CM | POA: Insufficient documentation

## 2017-09-17 LAB — POCT URINALYSIS DIP (DEVICE)
BILIRUBIN URINE: NEGATIVE
GLUCOSE, UA: NEGATIVE mg/dL
Ketones, ur: NEGATIVE mg/dL
NITRITE: NEGATIVE
Protein, ur: NEGATIVE mg/dL
Specific Gravity, Urine: 1.01 (ref 1.005–1.030)
UROBILINOGEN UA: 0.2 mg/dL (ref 0.0–1.0)
pH: 5.5 (ref 5.0–8.0)

## 2017-09-17 NOTE — BH Specialist Note (Signed)
Integrated Behavioral Health Initial Visit  MRN: [id number] Name: Misty Allen  Number of Integrated Behavioral Health Clinician visits:: 1/6 Session Start time: 2:44 Session End time: 2:56 Total time: 12 minutes  Type of Service: Integrated Behavioral Health- Individual/Family Interpretor:Yes.   Interpretor Name and Language: Arabic    Warm Hand Off Completed.       SUBJECTIVE: Misty Allen is a 32 y.o. female accompanied by n/a Patient was referred by Thressa Sheller, CNM for Initial OB introduction to integrated behavioral health services . Patient reports the following symptoms/concerns: Pt states more fatigue this pregnancy; no other concerns.  Duration of problem: Current pregnancy; Severity of problem: mild  OBJECTIVE: Mood: Normal and Affect: Appropriate Risk of harm to self or others: No plan to harm self or others  LIFE CONTEXT: Family and Social: Pt lives with her husband and 2yo daughter School/Work: - Self-Care: - Life Changes: Current pregnancy   GOALS ADDRESSED: Patient will: 1. Increase knowledge and/or ability of: healthy habits  2. Demonstrate ability to: Increase healthy adjustment to current life circumstances  INTERVENTIONS: Interventions utilized: Supportive Counseling  Standardized Assessments completed: GAD-7 and PHQ 9  ASSESSMENT: Patient currently experiencing Supervision of other normal pregnancy, antepartum.   Patient may benefit from Initial OB introduction to integrated behavioral health services .  PLAN: 1. Follow up with behavioral health clinician on : As needed 2. Behavioral recommendations:  -Continue taking prenatal vitamin, as recommended by medical provider 3. Referral(s): Integrated Behavioral Health Services (In Clinic) 4. "From scale of 1-10, how likely are you to follow plan?": 10  Rae Lips, LCSW  Depression screen [facility] 2/9 09/17/2017 04/19/2015 03/30/2015 03/02/2015  Decreased Interest 1 0 0 0  Down,  Depressed, Hopeless 0 0 0 0  PHQ - 2 Score 1 0 0 0  Altered sleeping 2 - - -  Tired, decreased energy 2 - - -  Change in appetite 2 - - -  Feeling bad or failure about yourself  1 - - -  Trouble concentrating 1 - - -  Moving slowly or fidgety/restless 0 - - -  Suicidal thoughts 0 - - -  PHQ-9 Score 9 - - -   GAD 7 : Generalized Anxiety Score 09/17/2017  Nervous, Anxious, on Edge 0  Control/stop worrying 0  Worry too much - different things 0  Trouble relaxing 0  Restless 0  Easily annoyed or irritable 0  Afraid - awful might happen 0  Total GAD 7 Score 0

## 2017-09-17 NOTE — Patient Instructions (Signed)

## 2017-09-17 NOTE — Progress Notes (Signed)
Live Consulting civil engineer interpreter on [employer] id# [id number]// Pt experiencing back pain & headaches more often.

## 2017-09-17 NOTE — Progress Notes (Signed)
Subjective:   Misty Allen is a 32 y.o. G2P1001 at [redacted]w[redacted]d by LMP being seen today for her first obstetrical visit.  Her obstetrical history is significant for IUGR, VAVD . Patient does intend to breast feed. Pregnancy history fully reviewed.  Patient reports backache and headaches.   Denies any current medical conditions. Denies taking any medications at this time.   HISTORY: OB History  Gravida Para Term Preterm AB Living  2 1 1  0 0 1  SAB TAB Ectopic Multiple Live Births  0 0 0 0 1    # Outcome Date GA Lbr Len/2nd Weight Sex Delivery Anes PTL Lv  2 Current           1 Term 06/17/15 [redacted]w[redacted]d 02:00 / 00:09 5 lb 2.7 oz (2.345 kg) F Vag-Spont None  LIV     Name: TRENESE, SIMINGTON     Apgar1: 8  Apgar5: 9    Last pap smear was done 2016 and was normal  Past Medical History:  Diagnosis Date  . Dyspnea    Past Surgical History:  Procedure Laterality Date  . NO PAST SURGERIES     Family History  Problem Relation Age of Onset  . Diabetes Mother    Social History   Tobacco Use  . Smoking status: Never Smoker  . Smokeless tobacco: Current User  . Tobacco comment: Hookah- stopped when pregnant  Substance Use Topics  . Alcohol use: No  . Drug use: No   Allergies  Allergen Reactions  . Bee Venom Shortness Of Breath and Swelling    SOB   Current Outpatient Medications on File Prior to Visit  Medication Sig Dispense Refill  . acetaminophen (TYLENOL) 500 MG tablet Take 500 mg by mouth every 6 (six) hours as needed for moderate pain.    . Prenatal Vit-Fe Fumarate-FA (PRENATAL COMPLETE) 14-0.4 MG TABS Take 1 tablet by mouth daily. (Patient taking differently: Take 1 tablet by mouth daily. ) 60 each 6  . Prenatal Vit-Fe Fumarate-FA (PREPLUS) 27-1 MG TABS TAKE ONE TABLET BY MOUTH ONCE DAILY 30 tablet 13  . docusate sodium (COLACE) 100 MG capsule Take 1 capsule (100 mg total) by mouth every 12 (twelve) hours. (Patient not taking: Reported on 09/17/2017) 60 capsule 0  .  ibuprofen (ADVIL,MOTRIN) 600 MG tablet Take 1 tablet (600 mg total) by mouth every 6 (six) hours. (Patient not taking: Reported on 09/17/2017) 30 tablet 0   No current facility-administered medications on file prior to visit.     Review of Systems Pertinent items noted in HPI and remainder of comprehensive ROS otherwise negative.  Exam   Vitals:   09/17/17 1341  BP: 114/67  Pulse: 81  Weight: 142 lb 4.8 oz (64.5 kg)   Fetal Heart Rate (bpm): 169  Uterus:   10 weeks size   Pelvic Exam: Perineum: no hemorrhoids, normal perineum   Vulva: normal external genitalia, no lesions   Vagina:  normal mucosa, normal discharge   Cervix: no lesions and normal, pap smear done.    Adnexa: normal adnexa and no mass, fullness, tenderness   Bony Pelvis: average  System: General: well-developed, well-nourished female in no acute distress   Breast:  normal appearance, no masses or tenderness   Skin: normal coloration and turgor, no rashes   Neurologic: oriented, normal, negative, normal mood   Extremities: normal strength, tone, and muscle mass, ROM of all joints is normal   HEENT PERRLA, extraocular movement intact and sclera clear, anicteric   Mouth/Teeth  mucous membranes moist, pharynx normal without lesions and dental hygiene good   Neck supple and no masses   Cardiovascular: regular rate and rhythm   Respiratory:  no respiratory distress, normal breath sounds   Abdomen: soft, non-tender; bowel sounds normal; no masses,  no organomegaly    Assessment:   Pregnancy: G2P1001 Patient Active Problem List   Diagnosis Date Noted  . Supervision of other normal pregnancy, antepartum 09/17/2017  . Status post vacuum-assisted vaginal delivery 06/19/2015  . Language barrier, cultural differences 05/17/2015     Plan:  1. Supervision of other normal pregnancy, antepartum - Routine care - Cystic Fibrosis Mutation 97 - HgB A1c - Culture, OB Urine - Cystic fibrosis gene test - Cytology - PAP -  Genetic Screening - Hemoglobinopathy Evaluation - Obstetric Panel, Including HIV - SMN1 COPY NUMBER ANALYSIS (SMA Carrier Screen) - [place] MFM OB COMP + 14 WK; Future   Initial labs drawn. Continue prenatal vitamins. Genetic Screening discussed, NIPS: requested. Ultrasound discussed; fetal anatomic survey: requested. Problem list reviewed and updated. The nature of [facility] with multiple MDs and other Advanced Practice Providers was explained to patient; also emphasized that residents, students are part of our team. Routine obstetric precautions reviewed. 50% of 45 min visit spent in counseling and coordination of care. Return in about 4 weeks (around 10/15/2017).

## 2017-09-19 LAB — CYTOLOGY - PAP
Chlamydia: NEGATIVE
DIAGNOSIS: NEGATIVE
HPV (WINDOPATH): NOT DETECTED
Neisseria Gonorrhea: NEGATIVE

## 2017-09-19 LAB — URINE CULTURE, OB REFLEX: Organism ID, Bacteria: NO GROWTH

## 2017-09-19 LAB — CULTURE, OB URINE

## 2017-09-23 ENCOUNTER — Encounter: Payer: Self-pay | Admitting: *Deleted

## 2017-09-25 LAB — OBSTETRIC PANEL, INCLUDING HIV
Antibody Screen: NEGATIVE
BASOS ABS: 0 10*3/uL (ref 0.0–0.2)
Basos: 0 %
EOS (ABSOLUTE): 0 10*3/uL (ref 0.0–0.4)
Eos: 1 %
HIV SCREEN 4TH GENERATION: NONREACTIVE
Hematocrit: 37.9 % (ref 34.0–46.6)
Hemoglobin: 13.1 g/dL (ref 11.1–15.9)
Hepatitis B Surface Ag: NEGATIVE
Immature Grans (Abs): 0 10*3/uL (ref 0.0–0.1)
Immature Granulocytes: 0 %
LYMPHS ABS: 1.7 10*3/uL (ref 0.7–3.1)
Lymphs: 26 %
MCH: 28.7 pg (ref 26.6–33.0)
MCHC: 34.6 g/dL (ref 31.5–35.7)
MCV: 83 fL (ref 79–97)
MONOS ABS: 0.3 10*3/uL (ref 0.1–0.9)
Monocytes: 5 %
NEUTROS ABS: 4.4 10*3/uL (ref 1.4–7.0)
Neutrophils: 68 %
PLATELETS: 248 10*3/uL (ref 150–450)
RBC: 4.56 x10E6/uL (ref 3.77–5.28)
RDW: 14.5 % (ref 12.3–15.4)
RH TYPE: POSITIVE
RPR Ser Ql: NONREACTIVE
Rubella Antibodies, IGG: 9.48 index (ref >0.99)
WBC: 6.5 10*3/uL (ref 3.4–10.8)

## 2017-09-25 LAB — SMN1 COPY NUMBER ANALYSIS (SMA CARRIER SCREENING)

## 2017-09-25 LAB — HEMOGLOBINOPATHY EVALUATION
Ferritin: 40 ng/mL (ref 15–150)
HGB A2 QUANT: 2.6 % (ref 1.8–3.2)
HGB A: 97.4 % (ref 96.4–98.8)
HGB C: 0 %
HGB S: 0 %
HGB VARIANT: 0 %
Hgb F Quant: 0 % (ref 0.0–2.0)
Hgb Solubility: NEGATIVE

## 2017-09-25 LAB — CYSTIC FIBROSIS GENE TEST

## 2017-10-15 ENCOUNTER — Ambulatory Visit (INDEPENDENT_AMBULATORY_CARE_PROVIDER_SITE_OTHER): Payer: Medicaid Other | Admitting: Advanced Practice Midwife

## 2017-10-15 ENCOUNTER — Encounter: Payer: Self-pay | Admitting: Advanced Practice Midwife

## 2017-10-15 DIAGNOSIS — Z3482 Encounter for supervision of other normal pregnancy, second trimester: Secondary | ICD-10-CM | POA: Diagnosis not present

## 2017-10-15 DIAGNOSIS — Z3480 Encounter for supervision of other normal pregnancy, unspecified trimester: Secondary | ICD-10-CM

## 2017-10-15 NOTE — Patient Instructions (Signed)
Second Trimester of Pregnancy The second trimester is from week 13 through week 28, month 4 through 6. This is often the time in pregnancy that you feel your best. Often times, morning sickness has lessened or quit. You may have more energy, and you may get hungry more often. Your unborn baby (fetus) is growing rapidly. At the end of the sixth month, he or she is about 9 inches long and weighs about 1 pounds. You will likely feel the baby move (quickening) between 18 and 20 weeks of pregnancy. Follow these instructions at home:  Avoid all smoking, herbs, and alcohol. Avoid drugs not approved by your doctor.  Do not use any tobacco products, including cigarettes, chewing tobacco, and electronic cigarettes. If you need help quitting, ask your doctor. You may get counseling or other support to help you quit.  Only take medicine as told by your doctor. Some medicines are safe and some are not during pregnancy.  Exercise only as told by your doctor. Stop exercising if you start having cramps.  Eat regular, healthy meals.  Wear a good support bra if your breasts are tender.  Do not use hot tubs, steam rooms, or saunas.  Wear your seat belt when driving.  Avoid raw meat, uncooked cheese, and liter boxes and soil used by cats.  Take your prenatal vitamins.  Take 1500-2000 milligrams of calcium daily starting at the 20th week of pregnancy until you deliver your baby.  Try taking medicine that helps you poop (stool softener) as needed, and if your doctor approves. Eat more fiber by eating fresh fruit, vegetables, and whole grains. Drink enough fluids to keep your pee (urine) clear or pale yellow.  Take warm water baths (sitz baths) to soothe pain or discomfort caused by hemorrhoids. Use hemorrhoid cream if your doctor approves.  If you have puffy, bulging veins (varicose veins), wear support hose. Raise (elevate) your feet for 15 minutes, 3-4 times a day. Limit salt in your diet.  Avoid heavy  lifting, wear low heals, and sit up straight.  Rest with your legs raised if you have leg cramps or low back pain.  Visit your dentist if you have not gone during your pregnancy. Use a soft toothbrush to brush your teeth. Be gentle when you floss.  You can have sex (intercourse) unless your doctor tells you not to.  Go to your doctor visits. Get help if:  You feel dizzy.  You have mild cramps or pressure in your lower belly (abdomen).  You have a nagging pain in your belly area.  You continue to feel sick to your stomach (nauseous), throw up (vomit), or have watery poop (diarrhea).  You have bad smelling fluid coming from your vagina.  You have pain with peeing (urination). Get help right away if:  You have a fever.  You are leaking fluid from your vagina.  You have spotting or bleeding from your vagina.  You have severe belly cramping or pain.  You lose or gain weight rapidly.  You have trouble catching your breath and have chest pain.  You notice sudden or extreme puffiness (swelling) of your face, hands, ankles, feet, or legs.  You have not felt the baby move in over an hour.  You have severe headaches that do not go away with medicine.  You have vision changes. This information is not intended to replace advice given to you by your health care provider. Make sure you discuss any questions you have with your health care   provider. Document Released: 03/28/2009 Document Revised: 06/09/2015 Document Reviewed: 03/04/2012 Elsevier Interactive Patient Education  2017 Elsevier Inc.  

## 2017-10-15 NOTE — Progress Notes (Signed)
   PRENATAL VISIT NOTE  Subjective:  Misty Allen is a 32 y.o. G2P1001 at [redacted]w[redacted]d being seen today for ongoing prenatal care.  She is currently monitored for the following issues for this low-risk pregnancy and has Language barrier, cultural differences; Status post vacuum-assisted vaginal delivery; and Supervision of other normal pregnancy, antepartum on their problem list.  Patient reports no complaints.  Contractions: Not present. Vag. Bleeding: None.   . Denies leaking of fluid.   The following portions of the patient's history were reviewed and updated as appropriate: allergies, current medications, past family history, past medical history, past social history, past surgical history and problem list. Problem list updated.  Objective:   Vitals:   10/15/17 1625  BP: 123/68  Pulse: 88  Weight: 145 lb 8.1 oz (66 kg)    Fetal Status: Fetal Heart Rate (bpm): 152 Fundal Height: 14 cm       General:  Alert, oriented and cooperative. Patient is in no acute distress.  Skin: Skin is warm and dry. No rash noted.   Cardiovascular: Normal heart rate noted  Respiratory: Normal respiratory effort, no problems with respiration noted  Abdomen: Soft, gravid, appropriate for gestational age.  Pain/Pressure: Absent     Pelvic: Cervical exam deferred        Extremities: Normal range of motion.  Edema: None  Mental Status: Normal mood and affect. Normal behavior. Normal judgment and thought content.   Assessment and Plan:  Pregnancy: G2P1001 at [redacted]w[redacted]d  1. Supervision of other normal pregnancy, antepartum - Routine care - AFP at next visit - MFM [place] scheduled   Preterm labor symptoms and general obstetric precautions including but not limited to vaginal bleeding, contractions, leaking of fluid and fetal movement were reviewed in detail with the patient. Please refer to After Visit Summary for other counseling recommendations.  Return in about 4 weeks (around 11/12/2017).  Future Appointments   Date Time Provider Department Center  11/18/2017  1:15 PM [facility] [place] 4 [facility]    Thressa Sheller, CNM

## 2017-11-11 ENCOUNTER — Encounter (HOSPITAL_COMMUNITY): Payer: Self-pay

## 2017-11-12 ENCOUNTER — Other Ambulatory Visit (HOSPITAL_COMMUNITY)
Admission: RE | Admit: 2017-11-12 | Discharge: 2017-11-12 | Disposition: A | Discharge status: Home / Self Care | Payer: Medicaid Other | Source: Ambulatory Visit | Attending: Student | Admitting: Student

## 2017-11-12 ENCOUNTER — Ambulatory Visit (INDEPENDENT_AMBULATORY_CARE_PROVIDER_SITE_OTHER): Payer: Medicaid Other | Admitting: Student

## 2017-11-12 VITALS — BP 109/58 | HR 77 | Wt 152.7 lb

## 2017-11-12 DIAGNOSIS — Z3480 Encounter for supervision of other normal pregnancy, unspecified trimester: Secondary | ICD-10-CM

## 2017-11-12 DIAGNOSIS — Z3A18 18 weeks gestation of pregnancy: Secondary | ICD-10-CM | POA: Diagnosis not present

## 2017-11-12 DIAGNOSIS — Z3482 Encounter for supervision of other normal pregnancy, second trimester: Secondary | ICD-10-CM | POA: Insufficient documentation

## 2017-11-12 NOTE — Progress Notes (Signed)
   PRENATAL VISIT NOTE  Subjective:  Misty Allen is a 32 y.o. G2P1001 at [redacted]w[redacted]d being seen today for ongoing prenatal care.  She is currently monitored for the following issues for this low-risk pregnancy and has Language barrier, cultural differences; Status post vacuum-assisted vaginal delivery; and Supervision of other normal pregnancy, antepartum on their problem list.  Patient reports yellow discharge. No itching or burning. .  Contractions: Not present. Vag. Bleeding: None.  Movement: Present. Denies leaking of fluid.   The following portions of the patient's history were reviewed and updated as appropriate: allergies, current medications, past family history, past medical history, past social history, past surgical history and problem list. Problem list updated.  Objective:   Vitals:   11/12/17 1548  BP: (!) 109/58  Pulse: 77  Weight: 152 lb 11.2 oz (69.3 kg)    Fetal Status: Fetal Heart Rate (bpm): 150   Movement: Present     General:  Alert, oriented and cooperative. Patient is in no acute distress.  Skin: Skin is warm and dry. No rash noted.   Cardiovascular: Normal heart rate noted  Respiratory: Normal respiratory effort, no problems with respiration noted  Abdomen: Soft, gravid, appropriate for gestational age.  Pain/Pressure: Present     Pelvic: Cervical exam deferred        Extremities: Normal range of motion.  Edema: None  Mental Status: Normal mood and affect. Normal behavior. Normal judgment and thought content.   Assessment and Plan:  Pregnancy: G2P1001 at [redacted]w[redacted]d  1. Supervision of other normal pregnancy, antepartum -Doing well; wet prep done today to check for yeast, BV, STIs.  - AFP, Serum, Open Spina Bifida -Reviewed dates of [place]; patient knows where to go for appt.   Preterm labor symptoms and general obstetric precautions including but not limited to vaginal bleeding, contractions, leaking of fluid and fetal movement were reviewed in detail with the  patient. Please refer to After Visit Summary for other counseling recommendations.  No follow-ups on file.  Future Appointments  Date Time Provider Department Center  11/18/2017  1:15 PM [facility] [place] 4 [facility]  12/10/2017  3:55 PM Katrinka Blazing, [place], CNM [facility]    Marylene Land, [place]

## 2017-11-12 NOTE — Patient Instructions (Signed)
Second Trimester of Pregnancy The second trimester is from week 13 through week 28, month 4 through 6. This is often the time in pregnancy that you feel your best. Often times, morning sickness has lessened or quit. You may have more energy, and you may get hungry more often. Your unborn baby (fetus) is growing rapidly. At the end of the sixth month, he or she is about 9 inches long and weighs about 1 pounds. You will likely feel the baby move (quickening) between 18 and 20 weeks of pregnancy. Follow these instructions at home:  Avoid all smoking, herbs, and alcohol. Avoid drugs not approved by your doctor.  Do not use any tobacco products, including cigarettes, chewing tobacco, and electronic cigarettes. If you need help quitting, ask your doctor. You may get counseling or other support to help you quit.  Only take medicine as told by your doctor. Some medicines are safe and some are not during pregnancy.  Exercise only as told by your doctor. Stop exercising if you start having cramps.  Eat regular, healthy meals.  Wear a good support bra if your breasts are tender.  Do not use hot tubs, steam rooms, or saunas.  Wear your seat belt when driving.  Avoid raw meat, uncooked cheese, and liter boxes and soil used by cats.  Take your prenatal vitamins.  Take 1500-2000 milligrams of calcium daily starting at the 20th week of pregnancy until you deliver your baby.  Try taking medicine that helps you poop (stool softener) as needed, and if your doctor approves. Eat more fiber by eating fresh fruit, vegetables, and whole grains. Drink enough fluids to keep your pee (urine) clear or pale yellow.  Take warm water baths (sitz baths) to soothe pain or discomfort caused by hemorrhoids. Use hemorrhoid cream if your doctor approves.  If you have puffy, bulging veins (varicose veins), wear support hose. Raise (elevate) your feet for 15 minutes, 3-4 times a day. Limit salt in your diet.  Avoid heavy  lifting, wear low heals, and sit up straight.  Rest with your legs raised if you have leg cramps or low back pain.  Visit your dentist if you have not gone during your pregnancy. Use a soft toothbrush to brush your teeth. Be gentle when you floss.  You can have sex (intercourse) unless your doctor tells you not to.  Go to your doctor visits. Get help if:  You feel dizzy.  You have mild cramps or pressure in your lower belly (abdomen).  You have a nagging pain in your belly area.  You continue to feel sick to your stomach (nauseous), throw up (vomit), or have watery poop (diarrhea).  You have bad smelling fluid coming from your vagina.  You have pain with peeing (urination). Get help right away if:  You have a fever.  You are leaking fluid from your vagina.  You have spotting or bleeding from your vagina.  You have severe belly cramping or pain.  You lose or gain weight rapidly.  You have trouble catching your breath and have chest pain.  You notice sudden or extreme puffiness (swelling) of your face, hands, ankles, feet, or legs.  You have not felt the baby move in over an hour.  You have severe headaches that do not go away with medicine.  You have vision changes. This information is not intended to replace advice given to you by your health care provider. Make sure you discuss any questions you have with your health care   provider. Document Released: 03/28/2009 Document Revised: 06/09/2015 Document Reviewed: 03/04/2012 Elsevier Interactive Patient Education  2017 Elsevier Inc.  

## 2017-11-13 LAB — CERVICOVAGINAL ANCILLARY ONLY
BACTERIAL VAGINITIS: NEGATIVE
CANDIDA VAGINITIS: POSITIVE — AB
Chlamydia: NEGATIVE
Neisseria Gonorrhea: NEGATIVE
Trichomonas: NEGATIVE

## 2017-11-14 ENCOUNTER — Other Ambulatory Visit: Payer: Self-pay | Admitting: Student

## 2017-11-14 ENCOUNTER — Telehealth: Payer: Self-pay | Admitting: *Deleted

## 2017-11-14 MED ORDER — TERCONAZOLE 0.4 % VA CREA: 1.0000 | TOPICAL_CREAM | Freq: Every day | VAGINAL | 0 refills | Status: DC

## 2017-11-14 NOTE — Telephone Encounter (Signed)
I called Misty Allen with [employer] [phone] and left a message we are calling with some information- please call our office.

## 2017-11-14 NOTE — Telephone Encounter (Signed)
-----   Message from Marylene Land, CNM sent at 11/14/2017 11:20 AM EDT ----- This patient needs terazole; she has a yeast infection. She is arabic speaking, although her husband speaks [place]. Do you mind calling her and telling her about the medicine? Thank you!

## 2017-11-15 LAB — AFP, SERUM, OPEN SPINA BIFIDA
AFP MOM: 0.62
AFP VALUE AFPOSL: 32.8 ng/mL
Gest. Age on Collection Date: 19.6 weeks
Maternal Age At EDD: 30.6 yr
OSBR Risk 1 IN: 10000
Test Results:: NEGATIVE
WEIGHT: 152 [lb_av]

## 2017-11-18 ENCOUNTER — Ambulatory Visit (HOSPITAL_COMMUNITY)
Admission: RE | Admit: 2017-11-18 | Discharge: 2017-11-18 | Disposition: A | Discharge status: Home / Self Care | Payer: Medicaid Other | Source: Ambulatory Visit | Attending: Advanced Practice Midwife | Admitting: Advanced Practice Midwife

## 2017-11-18 DIAGNOSIS — Z3689 Encounter for other specified antenatal screening: Secondary | ICD-10-CM | POA: Diagnosis not present

## 2017-11-18 DIAGNOSIS — Z3A19 19 weeks gestation of pregnancy: Secondary | ICD-10-CM

## 2017-11-18 DIAGNOSIS — Z363 Encounter for antenatal screening for malformations: Secondary | ICD-10-CM | POA: Diagnosis not present

## 2017-11-18 DIAGNOSIS — Z3480 Encounter for supervision of other normal pregnancy, unspecified trimester: Secondary | ICD-10-CM

## 2017-11-18 NOTE — Telephone Encounter (Signed)
I called pt spoke with her husband Chief Executive Officer). He stated that pt is not at home and he is her interpreter. I stated that I am calling with test results and to let her know about a prescription. He then stated that they have picked up the prescription for the yeast infection and she is using the medication. He asked how many nights she should use the cream and I stated 7. He had no further questions.

## 2017-12-02 ENCOUNTER — Encounter (HOSPITAL_COMMUNITY): Payer: Self-pay | Admitting: *Deleted

## 2017-12-02 ENCOUNTER — Telehealth: Payer: Self-pay | Admitting: *Deleted

## 2017-12-02 ENCOUNTER — Inpatient Hospital Stay (HOSPITAL_COMMUNITY)
Admission: AD | Admit: 2017-12-02 | Discharge: 2017-12-02 | Disposition: A | Discharge status: Home / Self Care | Payer: Medicaid Other | Source: Ambulatory Visit | Attending: Obstetrics & Gynecology | Admitting: Obstetrics & Gynecology

## 2017-12-02 DIAGNOSIS — M79604 Pain in right leg: Secondary | ICD-10-CM | POA: Insufficient documentation

## 2017-12-02 DIAGNOSIS — O26892 Other specified pregnancy related conditions, second trimester: Secondary | ICD-10-CM

## 2017-12-02 DIAGNOSIS — Z3A21 21 weeks gestation of pregnancy: Secondary | ICD-10-CM | POA: Insufficient documentation

## 2017-12-02 DIAGNOSIS — M79605 Pain in left leg: Secondary | ICD-10-CM | POA: Diagnosis not present

## 2017-12-02 DIAGNOSIS — I863 Vulval varices: Secondary | ICD-10-CM

## 2017-12-02 DIAGNOSIS — O2212 Genital varices in pregnancy, second trimester: Secondary | ICD-10-CM | POA: Diagnosis not present

## 2017-12-02 DIAGNOSIS — Z3A09 9 weeks gestation of pregnancy: Secondary | ICD-10-CM

## 2017-12-02 DIAGNOSIS — R102 Pelvic and perineal pain: Secondary | ICD-10-CM

## 2017-12-02 DIAGNOSIS — O26899 Other specified pregnancy related conditions, unspecified trimester: Secondary | ICD-10-CM

## 2017-12-02 MED ORDER — COMFORT FIT MATERNITY SUPP LG MISC: 1.0000 | Freq: Every day | 0 refills | Status: DC | PRN

## 2017-12-02 NOTE — Telephone Encounter (Signed)
Message left by pt's husband @ 1158 today stating that he needs to speak with someone about his wife. He further stated that she is about 5 months pregnant and is having pain. Also, "there is a piece of meat coming from her vagina and we need to know if this is normal."  I returned the call @ 1208 and spoke with pt's husband. He said she has been having pain for 4-5 days. He again stated that there is something coming out of her which looks like a piece of her vagina - like meat. He asked if this is normal and I replied no. I advised that [facility]. He voiced understanding.

## 2017-12-02 NOTE — MAU Provider Note (Signed)
History     CSN: [id number]  Arrival date and time: 12/02/17 1317   None     Chief Complaint  Patient presents with  . Abdominal Pain   HPI   Ms.Misty Allen is a 32 y.o. female G2P1001 @ [redacted]w[redacted]d here in MAU stating there is something coming out of her vagina. Says she first noticed this 1 week ago. She denies bleeding. Says when she uses the bathroom she feels pain at the top of her legs. She also attests to pain in both legs. The pain worsens in her legs when she is walking and when she is sitting down. No abdominal pain now, says the pain worsens when she moves and changes positions.   OB History    Gravida  2   Para  1   Term  1   Preterm  0   AB  0   Living  1     SAB  0   TAB  0   Ectopic  0   Multiple  0   Live Births  1           Past Medical History:  Diagnosis Date  . Dyspnea     Past Surgical History:  Procedure Laterality Date  . NO PAST SURGERIES      Family History  Problem Relation Age of Onset  . Diabetes Mother     Social History   Tobacco Use  . Smoking status: Never Smoker  . Smokeless tobacco: Current User  . Tobacco comment: Hookah- stopped when pregnant  Substance Use Topics  . Alcohol use: No  . Drug use: No    Allergies:  Allergies  Allergen Reactions  . Bee Venom Shortness Of Breath and Swelling    SOB    Medications Prior to Admission  Medication Sig Dispense Refill Last Dose  . acetaminophen (TYLENOL) 500 MG tablet Take 500 mg by mouth every 6 (six) hours as needed for moderate pain.   Past Week at Unknown time  . Prenatal Vit-Fe Fumarate-FA (PREPLUS) 27-1 MG TABS TAKE ONE TABLET BY MOUTH ONCE DAILY 30 tablet 13 Past Week at Unknown time  . terconazole (TERAZOL 7) 0.4 % vaginal cream Place 1 applicator vaginally at bedtime. 45 g 0 Past Month at Unknown time   No results found for this or any previous visit (from the past 48 hour(s)).  Review of Systems  Gastrointestinal: Positive for abdominal  pain.  Genitourinary: Negative for dysuria, vaginal bleeding and vaginal discharge.   Physical Exam   Blood pressure 115/68, pulse 99, temperature 98.2 F (36.8 C), temperature source Oral, resp. rate 18, height 5\' 1"  (1.549 m), weight 72.1 kg, last menstrual period 07/07/2017, SpO2 98 %, currently breastfeeding.  Physical Exam  Constitutional: She is oriented to person, place, and time. She appears well-developed and well-nourished. No distress.  HENT:  Head: Normocephalic.  GI: Soft. She exhibits no distension. There is no tenderness. There is no rebound.  Genitourinary:  Genitourinary Comments: Vagina - Small amount of white vaginal discharge, no odor, varicosity noted at introitus  Cervix - No contact bleeding, no active bleeding  Bimanual exam: Cervix closed, thick, posterior  Chaperone present for exam.   Musculoskeletal: Normal range of motion.  Neurological: She is alert and oriented to person, place, and time.  Skin: Skin is warm. She is not diaphoretic.  Psychiatric: Her behavior is normal.   MAU Course  Procedures  None  MDM  Interpretor used.   Assessment and  Plan   A:  1. Labial varicosities   2. Pain of round ligament affecting pregnancy, antepartum   3. Bilateral leg pain     P:  Discharge home in stable condition Return to MAU if symptoms worsen Pregnancy support belt recommended Information on normalcy of leg pain in pregnancy.   Venia Carbonasch, Jennifer I, NP 12/02/2017 3:15 PM

## 2017-12-02 NOTE — MAU Note (Signed)
Pt presents with complaint of "something coming out of vaginal" x 4 days, pain in lower abd x one week. deines bleeding orROM

## 2017-12-02 NOTE — Discharge Instructions (Signed)
Round Ligament Pain During Pregnancy   Round ligament pain is a sharp pain or jabbing feeling often felt in the lower belly or groin area on one or both sides. It is one of the most common complaints during pregnancy and is considered a normal part of pregnancy. It is most often felt during the second trimester.   Here is what you need to know about round ligament pain, including some tips to help you feel better.   Causes of Round Ligament Pain:    Several thick ligaments surround and support your womb (uterus) as it grows during pregnancy. One of them is called the round ligament.   The round ligament connects the front part of the womb to your groin, the area where your legs attach to your pelvis. The round ligament normally tightens and relaxes slowly.   As your baby and womb grow, the round ligament stretches. That makes it more likely to become strained.   Sudden movements can cause the ligament to tighten quickly, like a rubber band snapping. This causes a sudden and quick jabbing feeling.   Symptoms of Round Ligament Pain   Round ligament pain can be concerning and uncomfortable. But it is considered normal as your body changes during pregnancy.   The symptoms of round ligament pain include a sharp, sudden spasm in the belly. It usually affects the right side, but it may happen on both sides. The pain only lasts a few seconds.   Exercise may cause the pain, as will rapid movements such as:   sneezing  coughing  laughing  rolling over in bed  standing up too quickly   Treatment of Round Ligament Pain   Here are some tips that may help reduce your discomfort:   Pain relief. Take over-the-counter acetaminophen for pain, if necessary. Ask your doctor if this is OK.   Exercise. Get plenty of exercise to keep your stomach (core) muscles strong. Doing stretching exercises or prenatal yoga can be helpful. Ask your doctor which exercises are safe for you and your baby.   A  helpful exercise involves putting your hands and knees on the floor, lowering your head, and pushing your backside into the air.   Avoid sudden movements. Change positions slowly (such as standing up or sitting down) to avoid sudden movements that may cause stretching and pain.   Flex your hips. Bend and flex your hips before you cough, sneeze, or laugh to avoid pulling on the ligaments.   Apply warmth. A heating pad or warm bath may be helpful. Ask your doctor if this is OK. Extreme heat can be dangerous to the baby.   You should try to modify your daily activity level and avoid positions that may worsen the condition.   When to Call the Doctor/Midwife   Always tell your doctor or midwife about any type of pain you have during pregnancy. Round ligament pain is quick and doesn't last long.   Call your health care provider immediately if you have:   severe pain  fever  chills  pain on urination  difficulty walking   Belly pain during pregnancy can be due to many different causes. It is important for your doctor to rule out more serious conditions, including pregnancy complications such as placenta abruption or non-pregnancy illnesses such as:   inguinal hernia  appendicitis  stomach, liver, and kidney problems  Preterm labor pains may sometimes be mistaken for round ligament pain.        LEG  CRAMPS -- Leg cramps are common, usually occurring during the latter half of pregnancy. The cramps are due to painful muscle contractions and are generally experienced in the calves at night. They are thought to be secondary to a buildup of lactic and pyruvic acids leading to involuntary contraction of the affected muscles, but the exact etiology is unknown.  A Cochrane review found that the only placebo-controlled trial of calcium treatment showed no evidence of benefit in the treatment of leg cramps, but placebo controlled trials of magnesium supplementation suggested a possible benefit (odds  ratio 0.18, 95 percent CI 0.05 to 0.60). This was a small trial of 23 pregnant women with persistent leg cramps. The preparation used was magnesium lactate or citrate 5 mmol in the morning and 10 mmol in the evening.    Stretching exercises may be an effective preventive measure. These can be performed in the weight-bearing position; they are held for 20 seconds and repeated three times in succession, four times daily for one week, then twice daily thereafter.  If a cramp occurs, calf stretches (toe raises), walking, or leg jiggling followed by leg elevation may be helpful. Other nonpharmacologic remedies include: ?A hot shower or warm tub bath ?Ice massage ?Regular exercise for conditioning, calf strengthening and stretching ?Increased hydration ?Use of long-countered shoes and other proper foot gear.     PREGNANCY SUPPORT BELT: You are not alone, Seventy-five percent of women have some sort of abdominal or back pain at some point in their pregnancy. Your baby is growing at a fast pace, which means that your whole body is rapidly trying to adjust to the changes. As your uterus grows, your back may start feeling a bit under stress and this can result in back or abdominal pain that can go from mild, and therefore bearable, to severe pains that will not allow you to sit or lay down comfortably, When it comes to dealing with pregnancy-related pains and cramps, some pregnant women usually prefer natural remedies, which the market is filled with nowadays. For example, wearing a pregnancy support belt can help ease and lessen your discomfort and pain. WHAT ARE THE BENEFITS OF WEARING A PREGNANCY SUPPORT BELT? A pregnancy support belt provides support to the lower portion of the belly taking some of the weight of the growing uterus and distributing to the other parts of your body. It is designed make you comfortable and gives you extra support. Over the years, the pregnancy apparel market has been studying  the needs and wants of pregnant women and they have come up with the most comfortable pregnancy support belts that woman could ever ask for. In fact, you will no longer have to wear a stretched-out or bulky pregnancy belt that is visible underneath your clothes and makes you feel even more uncomfortable. Nowadays, a pregnancy support belt is made of comfortable and stretchy materials that will not irritate your skin but will actually make you feel at ease and you will not even notice you are wearing it. They are easy to put on and adjust during the day and can be worn at night for additional support.  BENEFITS:  Relives Back pain  Relieves Abdominal Muscle and Leg Pain  Stabilizes the Pelvic Ring  Offers a Cushioned Abdominal Lift Pad  Relieves pressure on the Sciatic Nerve Within Minutes WHERE TO GET YOUR PREGNANCY BELT: [employer] [phone] @2301  [address] [place], [place] [address]

## 2017-12-09 ENCOUNTER — Ambulatory Visit (INDEPENDENT_AMBULATORY_CARE_PROVIDER_SITE_OTHER): Payer: Medicaid Other | Admitting: Advanced Practice Midwife

## 2017-12-09 ENCOUNTER — Encounter: Payer: Self-pay | Admitting: Advanced Practice Midwife

## 2017-12-09 VITALS — BP 102/72 | HR 74 | Wt 161.4 lb

## 2017-12-09 DIAGNOSIS — Z3480 Encounter for supervision of other normal pregnancy, unspecified trimester: Secondary | ICD-10-CM

## 2017-12-09 DIAGNOSIS — Z3482 Encounter for supervision of other normal pregnancy, second trimester: Secondary | ICD-10-CM

## 2017-12-09 DIAGNOSIS — Z3A22 22 weeks gestation of pregnancy: Secondary | ICD-10-CM

## 2017-12-09 MED ORDER — COMFORT FIT MATERNITY SUPP SM MISC: 1.0000 | Freq: Every day | 0 refills | Status: DC | PRN

## 2017-12-09 NOTE — Progress Notes (Signed)
   PRENATAL VISIT NOTE  Subjective:  Misty Allen is a 32 y.o. G2P1001 at 4444w1d being seen today for ongoing prenatal care.  She is currently monitored for the following issues for this low-risk pregnancy and has Language barrier, cultural differences; Status post vacuum-assisted vaginal delivery; and Supervision of other normal pregnancy, antepartum on their problem list.  Patient reports no complaints.  Contractions: Not present. Vag. Bleeding: None.  Movement: Present. Denies leaking of fluid.   The following portions of the patient's history were reviewed and updated as appropriate: allergies, current medications, past family history, past medical history, past social history, past surgical history and problem list. Problem list updated.  Objective:   Vitals:   12/09/17 1509  BP: 102/72  Pulse: 74  Weight: 161 lb 6 oz (73.2 kg)    Fetal Status: Fetal Heart Rate (bpm): 154 Fundal Height: 23 cm Movement: Present     General:  Alert, oriented and cooperative. Patient is in no acute distress.  Skin: Skin is warm and dry. No rash noted.   Cardiovascular: Normal heart rate noted  Respiratory: Normal respiratory effort, no problems with respiration noted  Abdomen: Soft, gravid, appropriate for gestational age.  Pain/Pressure: Present     Pelvic: Cervical exam deferred        Extremities: Normal range of motion.  Edema: None  Mental Status: Normal mood and affect. Normal behavior. Normal judgment and thought content.   Assessment and Plan:  Pregnancy: G2P1001 at 2544w1d  1. Supervision of other normal pregnancy, antepartum - Routine care  - CBC; Future - HIV Antibody (routine testing w rflx); Future - RPR; Future - Glucose Tolerance, 2 Hours w/1 Hour; Future - RX provided for maternity support belt   Preterm labor symptoms and general obstetric precautions including but not limited to vaginal bleeding, contractions, leaking of fluid and fetal movement were reviewed in detail  with the patient. Please refer to After Visit Summary for other counseling recommendations.  Return in about 4 weeks (around 01/06/2018) for Needs 2hour GTT and 28 week labs at next visit .  No future appointments.  Thressa ShellerHeather Yaniah Thiemann, CNM

## 2017-12-09 NOTE — Patient Instructions (Signed)

## 2017-12-10 ENCOUNTER — Encounter: Payer: Self-pay | Admitting: Advanced Practice Midwife

## 2018-01-06 ENCOUNTER — Other Ambulatory Visit: Payer: Self-pay | Admitting: *Deleted

## 2018-01-06 NOTE — Progress Notes (Signed)
Encounter opened in error

## 2018-01-10 ENCOUNTER — Other Ambulatory Visit: Payer: Medicaid Other

## 2018-01-10 ENCOUNTER — Ambulatory Visit (INDEPENDENT_AMBULATORY_CARE_PROVIDER_SITE_OTHER): Payer: Medicaid Other | Admitting: Student

## 2018-01-10 VITALS — BP 111/71 | HR 88 | Wt 161.9 lb

## 2018-01-10 DIAGNOSIS — Z23 Encounter for immunization: Secondary | ICD-10-CM

## 2018-01-10 DIAGNOSIS — Z603 Acculturation difficulty: Secondary | ICD-10-CM | POA: Diagnosis not present

## 2018-01-10 DIAGNOSIS — Z3480 Encounter for supervision of other normal pregnancy, unspecified trimester: Secondary | ICD-10-CM

## 2018-01-10 DIAGNOSIS — Z3483 Encounter for supervision of other normal pregnancy, third trimester: Secondary | ICD-10-CM

## 2018-01-10 NOTE — Progress Notes (Signed)
   PRENATAL VISIT NOTE  Subjective:  Misty Allen is a 32 y.o. G2P1001 at 4849w5d being seen today for ongoing prenatal care.  She is currently monitored for the following issues for this low-risk pregnancy and has Language barrier, cultural differences; Status post vacuum-assisted vaginal delivery; and Supervision of other normal pregnancy, antepartum on their problem list.  Patient reports no complaints.  Contractions: Not present. Vag. Bleeding: None.  Movement: Present. Denies leaking of fluid.   The following portions of the patient's history were reviewed and updated as appropriate: allergies, current medications, past family history, past medical history, past social history, past surgical history and problem list. Problem list updated.  Objective:   Vitals:   01/10/18 0949  BP: 111/71  Pulse: 88  Weight: 161 lb 14.4 oz (73.4 kg)    Fetal Status: Fetal Heart Rate (bpm): 145 Fundal Height: 27 cm Movement: Present     General:  Alert, oriented and cooperative. Patient is in no acute distress.  Skin: Skin is warm and dry. No rash noted.   Cardiovascular: Normal heart rate noted  Respiratory: Normal respiratory effort, no problems with respiration noted  Abdomen: Soft, gravid, appropriate for gestational age.  Pain/Pressure: Present     Pelvic: Cervical exam deferred        Extremities: Normal range of motion.  Edema: None  Mental Status: Normal mood and affect. Normal behavior. Normal judgment and thought content.   Assessment and Plan:  Pregnancy: G2P1001 at 3849w5d  1. Supervision of other normal pregnancy, antepartum  - Flu Vaccine QUAD 36+ mos IM  2. Language barrier, cultural differences -video interpreter used for Arabic  Preterm labor symptoms and general obstetric precautions including but not limited to vaginal bleeding, contractions, leaking of fluid and fetal movement were reviewed in detail with the patient. Please refer to After Visit Summary for other  counseling recommendations.  Return in about 4 weeks (around 02/07/2018) for Routine OB.  No future appointments.  Judeth HornErin Dimitris Shanahan, NP

## 2018-01-10 NOTE — Patient Instructions (Signed)

## 2018-01-11 LAB — RPR: RPR: NONREACTIVE

## 2018-01-11 LAB — CBC
HEMOGLOBIN: 11.6 g/dL (ref 11.1–15.9)
Hematocrit: 32.8 % — ABNORMAL LOW (ref 34.0–46.6)
MCH: 29.9 pg (ref 26.6–33.0)
MCHC: 35.4 g/dL (ref 31.5–35.7)
MCV: 85 fL (ref 79–97)
Platelets: 235 10*3/uL (ref 150–450)
RBC: 3.88 x10E6/uL (ref 3.77–5.28)
RDW: 13.1 % (ref 12.3–15.4)
WBC: 7.4 10*3/uL (ref 3.4–10.8)

## 2018-01-11 LAB — GLUCOSE TOLERANCE, 2 HOURS W/ 1HR
GLUCOSE, 1 HOUR: 104 mg/dL (ref 65–179)
GLUCOSE, 2 HOUR: 93 mg/dL (ref 65–152)
Glucose, Fasting: 71 mg/dL (ref 65–91)

## 2018-01-11 LAB — HIV ANTIBODY (ROUTINE TESTING W REFLEX): HIV Screen 4th Generation wRfx: NONREACTIVE

## 2018-01-15 NOTE — L&D Delivery Note (Signed)
Patient: Misty Allen MRN: [id number]  GBS status: Unknown, IAP given: None   Patient is a 33 y.o. now G2P2 s/p NSVD at [redacted]w[redacted]d, who was admitted for SOL. SROM approximately 2.5 hours prior to delivery with clear fluid.    Delivery Note At 4:33 AM a viable female was delivered via Vaginal, Spontaneous (Presentation:LOA  ).  APGAR: 8, 9; weight pending.   Placenta status: Spontaneous, intact.  Cord: 3 vessel with the following complications: true knot x1 noted.  Cord pH: : N/A.   Anesthesia:  None  Episiotomy: None Lacerations: None  Suture Repair: None  Est. Blood Loss (mL): 102  Called to room, patient complete and involuntarily pushing. Head delivered LOA. No nuchal cord present. Shoulder and body delivered in usual fashion. Infant with spontaneous cry, placed on mother's abdomen, dried and bulb suctioned. Umbilical cord notable for true knot x1. Cord clamped x 2 after 1-minute delay, and cut by family member. Cord blood drawn. Placenta delivered spontaneously with gentle cord traction. Fundus firm with massage and IM Pitocin. Perineum inspected and found to have no lacerations.  Mom to postpartum.  Baby to Couplet care / Skin to Skin.  De Hollingshead 03/28/2018, 4:50 AM

## 2018-01-24 ENCOUNTER — Other Ambulatory Visit: Payer: Self-pay

## 2018-01-24 ENCOUNTER — Encounter (HOSPITAL_COMMUNITY): Payer: Self-pay | Admitting: *Deleted

## 2018-01-24 ENCOUNTER — Telehealth: Payer: Self-pay | Admitting: *Deleted

## 2018-01-24 ENCOUNTER — Inpatient Hospital Stay (HOSPITAL_COMMUNITY)
Admission: AD | Admit: 2018-01-24 | Discharge: 2018-01-24 | Disposition: A | Discharge status: Home / Self Care | Payer: Medicaid Other | Attending: Family Medicine | Admitting: Family Medicine

## 2018-01-24 DIAGNOSIS — Z3A28 28 weeks gestation of pregnancy: Secondary | ICD-10-CM | POA: Diagnosis not present

## 2018-01-24 DIAGNOSIS — O26893 Other specified pregnancy related conditions, third trimester: Secondary | ICD-10-CM | POA: Diagnosis not present

## 2018-01-24 DIAGNOSIS — N949 Unspecified condition associated with female genital organs and menstrual cycle: Secondary | ICD-10-CM

## 2018-01-24 DIAGNOSIS — Z0371 Encounter for suspected problem with amniotic cavity and membrane ruled out: Secondary | ICD-10-CM | POA: Diagnosis not present

## 2018-01-24 DIAGNOSIS — L293 Anogenital pruritus, unspecified: Secondary | ICD-10-CM | POA: Diagnosis present

## 2018-01-24 DIAGNOSIS — O23593 Infection of other part of genital tract in pregnancy, third trimester: Secondary | ICD-10-CM | POA: Diagnosis not present

## 2018-01-24 DIAGNOSIS — N760 Acute vaginitis: Secondary | ICD-10-CM

## 2018-01-24 DIAGNOSIS — N898 Other specified noninflammatory disorders of vagina: Secondary | ICD-10-CM | POA: Diagnosis not present

## 2018-01-24 LAB — URINALYSIS, ROUTINE W REFLEX MICROSCOPIC
BILIRUBIN URINE: NEGATIVE
Glucose, UA: NEGATIVE mg/dL
Hgb urine dipstick: NEGATIVE
Ketones, ur: NEGATIVE mg/dL
Leukocytes, UA: NEGATIVE
Nitrite: NEGATIVE
Protein, ur: NEGATIVE mg/dL
Specific Gravity, Urine: 1.028 (ref 1.005–1.030)
pH: 5 (ref 5.0–8.0)

## 2018-01-24 LAB — WET PREP, GENITAL
Clue Cells Wet Prep HPF POC: NONE SEEN
Sperm: NONE SEEN
Trich, Wet Prep: NONE SEEN
Yeast Wet Prep HPF POC: NONE SEEN

## 2018-01-24 LAB — AMNISURE RUPTURE OF MEMBRANE (ROM) NOT AT ARMC: Amnisure ROM: NEGATIVE

## 2018-01-24 MED ORDER — CYCLOBENZAPRINE HCL 10 MG PO TABS: 10.0000 mg | ORAL_TABLET | Freq: Two times a day (BID) | ORAL | 0 refills | Status: DC | PRN

## 2018-01-24 MED ORDER — TERCONAZOLE 0.4 % VA CREA: 1.0000 | TOPICAL_CREAM | Freq: Every day | VAGINAL | 0 refills | Status: DC

## 2018-01-24 NOTE — Discharge Instructions (Signed)

## 2018-01-24 NOTE — MAU Provider Note (Signed)
History     CSN: [id number]  Arrival date and time: 01/24/18 1527   First Provider Initiated Contact with Patient 01/24/18 1643      Chief Complaint  Patient presents with  . Rupture of Membranes  . Abdominal Pain   HPI   Misty Allen is a 33 y.o. female G2P1001 @ [redacted]w[redacted]d here with vaginal itching and leaking of clear/watery discharge x1 week. She says the leaking is causing her underwear to be wet. She continues to have lower abdominal pain when she walks and changes positions. She wears her support belt when she is up walking around which helps some. The pain bothers her at night when she is trying to sleep. No bleeding. No fever.   OB History    Gravida  2   Para  1   Term  1   Preterm  0   AB  0   Living  1     SAB  0   TAB  0   Ectopic  0   Multiple  0   Live Births  1           Past Medical History:  Diagnosis Date  . Dyspnea     Past Surgical History:  Procedure Laterality Date  . NO PAST SURGERIES      Family History  Problem Relation Age of Onset  . Diabetes Mother     Social History   Tobacco Use  . Smoking status: Never Smoker  . Smokeless tobacco: Current User  . Tobacco comment: Hookah- stopped when pregnant  Substance Use Topics  . Alcohol use: No  . Drug use: No    Allergies:  Allergies  Allergen Reactions  . Bee Venom Shortness Of Breath and Swelling    SOB    Medications Prior to Admission  Medication Sig Dispense Refill Last Dose  . acetaminophen (TYLENOL) 500 MG tablet Take 500 mg by mouth every 6 (six) hours as needed for moderate pain.   Taking  . Elastic Bandages & Supports (COMFORT FIT MATERNITY SUPP SM) MISC 1 Device by Does not apply route daily as needed. 1 each 0 Taking  . Prenatal Vit-Fe Fumarate-FA (PREPLUS) 27-1 MG TABS TAKE ONE TABLET BY MOUTH ONCE DAILY 30 tablet 13 Taking   Results for orders placed or performed during the hospital encounter of 01/24/18 (from the past 48 hour(s))   Urinalysis, Routine w reflex microscopic     Status: Abnormal   Collection Time: 01/24/18  4:10 PM  Result Value Ref Range   Color, Urine AMBER (A) YELLOW    Comment: BIOCHEMICALS MAY BE AFFECTED BY COLOR   APPearance HAZY (A) CLEAR   Specific Gravity, Urine 1.028 1.005 - 1.030   pH 5.0 5.0 - 8.0   Glucose, UA NEGATIVE NEGATIVE mg/dL   Hgb urine dipstick NEGATIVE NEGATIVE   Bilirubin Urine NEGATIVE NEGATIVE   Ketones, ur NEGATIVE NEGATIVE mg/dL   Protein, ur NEGATIVE NEGATIVE mg/dL   Nitrite NEGATIVE NEGATIVE   Leukocytes, UA NEGATIVE NEGATIVE    Comment: Performed at [facility], [address] [place], [place] [address]  Amnisure rupture of membrane (rom)not at [facility]     Status: None   Collection Time: 01/24/18  5:00 PM  Result Value Ref Range   Amnisure ROM NEGATIVE     Comment: Performed at [facility], [address], [place], [place] [address]  Wet prep, genital     Status: Abnormal   Collection Time: 01/24/18  5:02 PM  Result Value Ref Range   Yeast Wet Prep HPF POC NONE SEEN NONE SEEN   Trich, Wet Prep NONE SEEN NONE SEEN   Clue Cells Wet Prep HPF POC NONE SEEN NONE SEEN   WBC, Wet Prep HPF POC MODERATE (A) NONE SEEN    Comment: MODERATE BACTERIA SEEN   Sperm NONE SEEN     Comment: Performed at [facility], [address], [place], [place] [address]   Review of Systems  Constitutional: Negative for fever.  Gastrointestinal: Negative for abdominal pain.  Genitourinary: Positive for pelvic pain and vaginal discharge. Negative for vaginal bleeding.   Physical Exam   Blood pressure (!) 110/58, pulse (!) 105, temperature 97.9 F (36.6 C), resp. rate 16, last menstrual period 07/07/2017, SpO2 99 %, currently breastfeeding.  Physical Exam  Constitutional: She is oriented to person, place, and time. She appears well-developed and well-nourished. No distress.  HENT:  Head: Normocephalic.  Eyes: Pupils are equal, round, and reactive to light.  GI: Soft.  She exhibits no distension. There is no abdominal tenderness. There is no rebound and no guarding.  Genitourinary:    Genitourinary Comments: Vagina - Small amount of white vaginal discharge, no odor, no pooling of fluid, some discharge adhered to vaginal wall.  Cervix - No contact bleeding, no active bleeding  Bimanual exam: Cervix closed Chaperone present for exam.    Musculoskeletal: Normal range of motion.  Neurological: She is alert and oriented to person, place, and time.  Skin: Skin is warm. She is not diaphoretic.  Psychiatric: Her behavior is normal.   Fetal Tracing: Baseline: 140 bpm Variability: Moderate  Accelerations: 10x10 Decelerations: None Toco: None  MAU Course  Procedures  None  MDM  Amnisure negative Pelvic exam without pooling   Assessment and Plan   A:  1. Round ligament pain   2. Vaginitis affecting pregnancy in third trimester, antepartum   3. Vaginal discharge in pregnancy in third trimester   4. Encounter for suspected premature rupture of amniotic membranes, with rupture of membranes not found     P:  Discharge home in stable condition Continue pregnancy support belt Rx: Terazol, Flexeril Return to MAU if symptoms worsen Follow up with OB  Jadie Allington, Harolyn Rutherford, NP 01/24/2018 6:50 PM

## 2018-01-24 NOTE — MAU Note (Signed)
Pt presents to MAU for leakage of clear fluid for a week with lower abdominal cramping. Denies any VB

## 2018-01-24 NOTE — Telephone Encounter (Signed)
Received a voicemail from a female stating he is Marlisha's husband and he is calling because she is 7 months pregnant and has been having water come out, pain in her vagina, and feels heaviness. He also stated other information that I did not understand.  I called patient with pacific interpreter [phone] and left a message we were returning her call.

## 2018-01-27 NOTE — Telephone Encounter (Signed)
Per chart review, pt went to MAU for evaluation of her concerns on the same day as her call. She was evaluated and did not have SROM. She was given Terconazole vaginal cream due to vaginal discharge.

## 2018-02-10 ENCOUNTER — Ambulatory Visit (INDEPENDENT_AMBULATORY_CARE_PROVIDER_SITE_OTHER): Payer: Medicaid Other | Admitting: Advanced Practice Midwife

## 2018-02-10 ENCOUNTER — Encounter: Payer: Self-pay | Admitting: Advanced Practice Midwife

## 2018-02-10 DIAGNOSIS — Z3480 Encounter for supervision of other normal pregnancy, unspecified trimester: Secondary | ICD-10-CM

## 2018-02-10 DIAGNOSIS — Z3483 Encounter for supervision of other normal pregnancy, third trimester: Secondary | ICD-10-CM | POA: Diagnosis present

## 2018-02-10 DIAGNOSIS — Z23 Encounter for immunization: Secondary | ICD-10-CM

## 2018-02-10 NOTE — Addendum Note (Signed)
Addended by: Ernestina Patches on: 02/10/2018 02:48 PM   Modules accepted: Orders

## 2018-02-10 NOTE — Progress Notes (Signed)
   PRENATAL VISIT NOTE  Subjective:  Misty Allen is a 33 y.o. G2P1001 at 5551w1d being seen today for ongoing prenatal care.  She is currently monitored for the following issues for this low-risk pregnancy and has Language barrier, cultural differences; Status post vacuum-assisted vaginal delivery; and Supervision of other normal pregnancy, antepartum on their problem list.  Patient reports no complaints.  Contractions: Not present. Vag. Bleeding: None.  Movement: Present. Denies leaking of fluid.   Completed treatment for yeast given in MAU. Reports that she is feeling better.  The following portions of the patient's history were reviewed and updated as appropriate: allergies, current medications, past family history, past medical history, past social history, past surgical history and problem list. Problem list updated.  Objective:   Vitals:   02/10/18 1422  BP: 111/65  Pulse: 67  Weight: 165 lb 3.2 oz (74.9 kg)    Fetal Status: Fetal Heart Rate (bpm): 150   Movement: Present     General:  Alert, oriented and cooperative. Patient is in no acute distress.  Skin: Skin is warm and dry. No rash noted.   Cardiovascular: Normal heart rate noted  Respiratory: Normal respiratory effort, no problems with respiration noted  Abdomen: Soft, gravid, appropriate for gestational age.  Pain/Pressure: Present     Pelvic: Cervical exam deferred        Extremities: Normal range of motion.  Edema: None  Mental Status: Normal mood and affect. Normal behavior. Normal judgment and thought content.   Assessment and Plan:  Pregnancy: G2P1001 at 5951w1d  1. Supervision of other normal pregnancy, antepartum - routine care - tdap today   Preterm labor symptoms and general obstetric precautions including but not limited to vaginal bleeding, contractions, leaking of fluid and fetal movement were reviewed in detail with the patient. Please refer to After Visit Summary for other counseling  recommendations.  Return in about 2 weeks (around 02/24/2018).  No future appointments.  Thressa ShellerHeather Sehar Sedano DNP, CNM  02/10/18  2:47 PM

## 2018-02-24 ENCOUNTER — Encounter: Payer: Self-pay | Admitting: Advanced Practice Midwife

## 2018-02-24 ENCOUNTER — Ambulatory Visit (INDEPENDENT_AMBULATORY_CARE_PROVIDER_SITE_OTHER): Payer: Medicaid Other | Admitting: Advanced Practice Midwife

## 2018-02-24 VITALS — BP 101/66 | HR 89 | Wt 168.8 lb

## 2018-02-24 DIAGNOSIS — Z3480 Encounter for supervision of other normal pregnancy, unspecified trimester: Secondary | ICD-10-CM

## 2018-02-24 DIAGNOSIS — Z3483 Encounter for supervision of other normal pregnancy, third trimester: Secondary | ICD-10-CM

## 2018-02-24 NOTE — Progress Notes (Signed)
   PRENATAL VISIT NOTE  Subjective:  Misty Allen is a 33 y.o. G2P1001 at 5761w1d being seen today for ongoing prenatal care.  She is currently monitored for the following issues for this low-risk pregnancy and has Language barrier, cultural differences; Status post vacuum-assisted vaginal delivery; and Supervision of other normal pregnancy, antepartum on their problem list.  Patient reports no complaints.  Contractions: Not present. Vag. Bleeding: None.  Movement: Present. Denies leaking of fluid.   The following portions of the patient's history were reviewed and updated as appropriate: allergies, current medications, past family history, past medical history, past social history, past surgical history and problem list. Problem list updated.  Objective:   Vitals:   02/24/18 1433  BP: 101/66  Pulse: 89  Weight: 168 lb 12.8 oz (76.6 kg)    Fetal Status: Fetal Heart Rate (bpm): 145 Fundal Height: 34 cm Movement: Present     General:  Alert, oriented and cooperative. Patient is in no acute distress.  Skin: Skin is warm and dry. No rash noted.   Cardiovascular: Normal heart rate noted  Respiratory: Normal respiratory effort, no problems with respiration noted  Abdomen: Soft, gravid, appropriate for gestational age.  Pain/Pressure: Present     Pelvic: Cervical exam deferred        Extremities: Normal range of motion.  Edema: None  Mental Status: Normal mood and affect. Normal behavior. Normal judgment and thought content.   Assessment and Plan:  Pregnancy: G2P1001 at 6361w1d  1. Supervision of other normal pregnancy, antepartum - Routine care  Preterm labor symptoms and general obstetric precautions including but not limited to vaginal bleeding, contractions, leaking of fluid and fetal movement were reviewed in detail with the patient. Please refer to After Visit Summary for other counseling recommendations.  Return in about 2 weeks (around 03/10/2018).  No future  appointments.  Thressa ShellerHeather Roniesha Hollingshead DNP, CNM  02/24/18  2:47 PM

## 2018-03-11 ENCOUNTER — Ambulatory Visit (INDEPENDENT_AMBULATORY_CARE_PROVIDER_SITE_OTHER): Payer: Medicaid Other | Admitting: Nurse Practitioner

## 2018-03-11 ENCOUNTER — Encounter: Payer: Self-pay | Admitting: Nurse Practitioner

## 2018-03-11 VITALS — BP 104/63 | HR 107 | Wt 169.8 lb

## 2018-03-11 DIAGNOSIS — Z3A35 35 weeks gestation of pregnancy: Secondary | ICD-10-CM

## 2018-03-11 DIAGNOSIS — Z3483 Encounter for supervision of other normal pregnancy, third trimester: Secondary | ICD-10-CM

## 2018-03-11 DIAGNOSIS — Z603 Acculturation difficulty: Secondary | ICD-10-CM

## 2018-03-11 DIAGNOSIS — Z3480 Encounter for supervision of other normal pregnancy, unspecified trimester: Secondary | ICD-10-CM

## 2018-03-11 NOTE — Progress Notes (Signed)
    Subjective:  Misty Allen is a 33 y.o. G2P1001 at [redacted]w[redacted]d being seen today for ongoing prenatal care.  She is currently monitored for the following issues for this low-risk pregnancy and has Language barrier, cultural differences; Status post vacuum-assisted vaginal delivery; and Supervision of other normal pregnancy, antepartum on their problem list. Interpreter present for the entire visit.  Patient reports one area on her abdomen hurts from time to time.  No bruising .Marland Kitchen  Contractions: Not present. Vag. Bleeding: None.  Movement: Present. Denies leaking of fluid.   The following portions of the patient's history were reviewed and updated as appropriate: allergies, current medications, past family history, past medical history, past social history, past surgical history and problem list. Problem list updated.  Objective:   Vitals:   03/11/18 1630  BP: 104/63  Pulse: (!) 107  Weight: 169 lb 12.8 oz (77 kg)    Fetal Status: Fetal Heart Rate (bpm): 141 Fundal Height: 36 cm Movement: Present     General:  Alert, oriented and cooperative. Patient is in no acute distress.  Skin: Skin is warm and dry. No rash noted.   Cardiovascular: Normal heart rate noted  Respiratory: Normal respiratory effort, no problems with respiration noted  Abdomen: Soft, gravid, appropriate for gestational age. Pain/Pressure: Present   No evidence of any problem on her abdomen - likely baby is rubbing the same spot and causing pain.  Pelvic:  Cervical exam deferred        Extremities: Normal range of motion.  Edema: None  Mental Status: Normal mood and affect. Normal behavior. Normal judgment and thought content.   Urinalysis:      Assessment and Plan:  Pregnancy: G2P1001 at [redacted]w[redacted]d  1. Supervision of other normal pregnancy, antepartum Doing well. Discussed where to go for birth.  Suggested watching the virtual tour and driving by Entrance C to know where it is.  2. Language barrier, cultural  differences Interpreter present.  Preterm labor symptoms and general obstetric precautions including but not limited to vaginal bleeding, contractions, leaking of fluid and fetal movement were reviewed in detail with the patient. Please refer to After Visit Summary for other counseling recommendations.  Return in about 1 week (around 03/18/2018).  Nolene Bernheim, RN, MSN, NP-BC Nurse Practitioner, [facility] for [employer], [facility] 03/11/2018 4:48 PM

## 2018-03-11 NOTE — Patient Instructions (Signed)

## 2018-03-27 ENCOUNTER — Other Ambulatory Visit (HOSPITAL_COMMUNITY)
Admission: RE | Admit: 2018-03-27 | Discharge: 2018-03-27 | Disposition: A | Discharge status: Home / Self Care | Payer: Medicaid Other | Source: Ambulatory Visit | Attending: Obstetrics and Gynecology | Admitting: Obstetrics and Gynecology

## 2018-03-27 ENCOUNTER — Ambulatory Visit (INDEPENDENT_AMBULATORY_CARE_PROVIDER_SITE_OTHER): Payer: Medicaid Other | Admitting: Advanced Practice Midwife

## 2018-03-27 ENCOUNTER — Other Ambulatory Visit: Payer: Self-pay

## 2018-03-27 ENCOUNTER — Encounter: Payer: Self-pay | Admitting: Advanced Practice Midwife

## 2018-03-27 VITALS — BP 108/77 | HR 90 | Wt 172.8 lb

## 2018-03-27 DIAGNOSIS — Z3480 Encounter for supervision of other normal pregnancy, unspecified trimester: Secondary | ICD-10-CM | POA: Diagnosis present

## 2018-03-27 DIAGNOSIS — Z3A37 37 weeks gestation of pregnancy: Secondary | ICD-10-CM

## 2018-03-27 DIAGNOSIS — Z3483 Encounter for supervision of other normal pregnancy, third trimester: Secondary | ICD-10-CM

## 2018-03-27 NOTE — Patient Instructions (Signed)
Vaginal delivery means that you give birth by pushing your baby out of your birth canal (vagina). A team of health care providers will help you before, during, and after vaginal delivery. Birth experiences are unique for every woman and every pregnancy, and birth experiences vary depending on where you choose to give birth. What happens when I arrive at the birth center or hospital? Once you are in labor and have been admitted into the hospital or birth center, your health care provider may:  Review your pregnancy history and any concerns that you have.  Insert an IV into one of your veins. This may be used to give you fluids and medicines.  Check your blood pressure, pulse, temperature, and heart rate (vital signs).  Check whether your bag of water (amniotic sac) has broken (ruptured).  Talk with you about your birth plan and discuss pain control options. Monitoring Your health care provider may monitor your contractions (uterine monitoring) and your baby's heart rate (fetal monitoring). You may need to be monitored:  Often, but not continuously (intermittently).  All the time or for long periods at a time (continuously). Continuous monitoring may be needed if: ? You are taking certain medicines, such as medicine to relieve pain or make your contractions stronger. ? You have pregnancy or labor complications. Monitoring may be done by:  Placing a special stethoscope or a handheld monitoring device on your abdomen to check your baby's heartbeat and to check for contractions.  Placing monitors on your abdomen (external monitors) to record your baby's heartbeat and the frequency and length of contractions.  Placing monitors inside your uterus through your vagina (internal monitors) to record your baby's heartbeat and the frequency, length, and strength of your contractions. Depending on the type of monitor, it may remain in your uterus or on your baby's head until birth.  Telemetry. This is  a type of continuous monitoring that can be done with external or internal monitors. Instead of having to stay in bed, you are able to move around during telemetry. Physical exam Your health care provider may perform frequent physical exams. This may include:  Checking how and where your baby is positioned in your uterus.  Checking your cervix to determine: ? Whether it is thinning out (effacing). ? Whether it is opening up (dilating). What happens during labor and delivery?  Normal labor and delivery is divided into the following three stages: Stage 1  This is the longest stage of labor.  This stage can last for hours or days.  Throughout this stage, you will feel contractions. Contractions generally feel mild, infrequent, and irregular at first. They get stronger, more frequent (about every 2-3 minutes), and more regular as you move through this stage.  This stage ends when your cervix is completely dilated to 4 inches (10 cm) and completely effaced. Stage 2  This stage starts once your cervix is completely effaced and dilated and lasts until the delivery of your baby.  This stage may last from 20 minutes to 2 hours.  This is the stage where you will feel an urge to push your baby out of your vagina.  You may feel stretching and burning pain, especially when the widest part of your baby's head passes through the vaginal opening (crowning).  Once your baby is delivered, the umbilical cord will be clamped and cut. This usually occurs after waiting a period of 1-2 minutes after delivery.  Your baby will be placed on your bare chest (skin-to-skin contact) in   an upright position and covered with a warm blanket. Watch your baby for feeding cues, like rooting or sucking, and help the baby to your breast for his or her first feeding. Stage 3  This stage starts immediately after the birth of your baby and ends after you deliver the placenta.  This stage may take anywhere from 5 to 30  minutes.  After your baby has been delivered, you will feel contractions as your body expels the placenta and your uterus contracts to control bleeding. What can I expect after labor and delivery?  After labor is over, you and your baby will be monitored closely until you are ready to go home to ensure that you are both healthy. Your health care team will teach you how to care for yourself and your baby.  You and your baby will stay in the same room (rooming in) during your hospital stay. This will encourage early bonding and successful breastfeeding.  You may continue to receive fluids and medicines through an IV.  Your uterus will be checked and massaged regularly (fundal massage).  You will have some soreness and pain in your abdomen, vagina, and the area of skin between your vaginal opening and your anus (perineum).  If an incision was made near your vagina (episiotomy) or if you had some vaginal tearing during delivery, cold compresses may be placed on your episiotomy or your tear. This helps to reduce pain and swelling.  You may be given a squirt bottle to use instead of wiping when you go to the bathroom. To use the squirt bottle, follow these steps: ? Before you urinate, fill the squirt bottle with warm water. Do not use hot water. ? After you urinate, while you are sitting on the toilet, use the squirt bottle to rinse the area around your urethra and vaginal opening. This rinses away any urine and blood. ? Fill the squirt bottle with clean water every time you use the bathroom.  It is normal to have vaginal bleeding after delivery. Wear a sanitary pad for vaginal bleeding and discharge. Summary  Vaginal delivery means that you will give birth by pushing your baby out of your birth canal (vagina).  Your health care provider may monitor your contractions (uterine monitoring) and your baby's heart rate (fetal monitoring).  Your health care provider may perform a physical  exam.  Normal labor and delivery is divided into three stages.  After labor is over, you and your baby will be monitored closely until you are ready to go home. This information is not intended to replace advice given to you by your health care provider. Make sure you discuss any questions you have with your health care provider. Document Released: 10/11/2007 Document Revised: 02/05/2017 Document Reviewed: 02/05/2017 Elsevier Interactive Patient Education  2019 Elsevier Inc.  

## 2018-03-27 NOTE — Progress Notes (Signed)
   PRENATAL VISIT NOTE  Subjective:  Misty Allen is a 33 y.o. G2P1001 at [redacted]w[redacted]d being seen today for ongoing prenatal care.  She is currently monitored for the following issues for this low-risk pregnancy and has Language barrier, cultural differences; Status post vacuum-assisted vaginal delivery; and Supervision of other normal pregnancy, antepartum on their problem list.  Patient reports no complaints.  Contractions: Not present. Vag. Bleeding: None.  Movement: Present. Denies leaking of fluid.   The following portions of the patient's history were reviewed and updated as appropriate: allergies, current medications, past family history, past medical history, past social history, past surgical history and problem list.   Objective:   Vitals:   03/27/18 1550  BP: 108/77  Pulse: 90  Weight: 172 lb 12.8 oz (78.4 kg)    Fetal Status: Fetal Heart Rate (bpm): 141 Fundal Height: 38 cm Movement: Present     General:  Alert, oriented and cooperative. Patient is in no acute distress.  Skin: Skin is warm and dry. No rash noted.   Cardiovascular: Normal heart rate noted  Respiratory: Normal respiratory effort, no problems with respiration noted  Abdomen: Soft, gravid, appropriate for gestational age.  Pain/Pressure: Present     Pelvic: Cervical exam performed Dilation: Fingertip Effacement (%): Thick Station: Ballotable  Extremities: Normal range of motion.  Edema: None  Mental Status: Normal mood and affect. Normal behavior. Normal judgment and thought content.   Assessment and Plan:  Pregnancy: G2P1001 at [redacted]w[redacted]d 1. Supervision of other normal pregnancy, antepartum - Routine care - Culture, beta strep (group b only) - Cervicovaginal ancillary only( [place])  Term labor symptoms and general obstetric precautions including but not limited to vaginal bleeding, contractions, leaking of fluid and fetal movement were reviewed in detail with the patient. Please refer to After Visit  Summary for other counseling recommendations.   Return in about 1 week (around 04/03/2018).  No future appointments.  Thressa Sheller DNP, CNM  03/27/18  4:21 PM

## 2018-03-28 ENCOUNTER — Inpatient Hospital Stay (HOSPITAL_COMMUNITY)
Admission: AD | Admit: 2018-03-28 | Discharge: 2018-03-30 | DRG: 807 | Disposition: A | Discharge status: Home / Self Care | Payer: Medicaid Other | Attending: Obstetrics & Gynecology | Admitting: Obstetrics & Gynecology

## 2018-03-28 ENCOUNTER — Other Ambulatory Visit: Payer: Self-pay

## 2018-03-28 ENCOUNTER — Encounter (HOSPITAL_COMMUNITY): Payer: Self-pay

## 2018-03-28 DIAGNOSIS — Z3A37 37 weeks gestation of pregnancy: Secondary | ICD-10-CM | POA: Diagnosis not present

## 2018-03-28 DIAGNOSIS — O26893 Other specified pregnancy related conditions, third trimester: Secondary | ICD-10-CM | POA: Diagnosis present

## 2018-03-28 HISTORY — DX: Other specified health status: Z78.9

## 2018-03-28 LAB — TYPE AND SCREEN
ABO/RH(D): O POS
Antibody Screen: NEGATIVE

## 2018-03-28 LAB — CBC
HCT: 34.6 % — ABNORMAL LOW (ref 36.0–46.0)
Hemoglobin: 11.6 g/dL — ABNORMAL LOW (ref 12.0–15.0)
MCH: 28.6 pg (ref 26.0–34.0)
MCHC: 33.5 g/dL (ref 30.0–36.0)
MCV: 85.4 fL (ref 80.0–100.0)
Platelets: 204 10*3/uL (ref 150–400)
RBC: 4.05 MIL/uL (ref 3.87–5.11)
RDW: 13.4 % (ref 11.5–15.5)
WBC: 12.1 10*3/uL — ABNORMAL HIGH (ref 4.0–10.5)
nRBC: 0 % (ref 0.0–0.2)

## 2018-03-28 LAB — CERVICOVAGINAL ANCILLARY ONLY
Chlamydia: NEGATIVE
Neisseria Gonorrhea: NEGATIVE

## 2018-03-28 LAB — POCT FERN TEST: POCT Fern Test: POSITIVE

## 2018-03-28 LAB — ABO/RH: ABO/RH(D): O POS

## 2018-03-28 MED ORDER — MISOPROSTOL 200 MCG PO TABS
800.0000 ug | ORAL_TABLET | Freq: Once | ORAL | Status: AC
  Administered 2018-03-28: 800 ug via RECTAL
  Filled 2018-03-28: qty 4

## 2018-03-28 MED ORDER — ONDANSETRON HCL 4 MG/2ML IJ SOLN: 4.0000 mg | Freq: Four times a day (QID) | INTRAMUSCULAR | Status: DC | PRN

## 2018-03-28 MED ORDER — LIDOCAINE HCL (PF) 1 % IJ SOLN
30.0000 mL | INTRAMUSCULAR | Status: AC | PRN
  Administered 2018-03-28: 30 mL via SUBCUTANEOUS

## 2018-03-28 MED ORDER — OXYTOCIN BOLUS FROM INFUSION: 500.0000 mL | Freq: Once | INTRAVENOUS | Status: DC

## 2018-03-28 MED ORDER — SIMETHICONE 80 MG PO CHEW: 80.0000 mg | CHEWABLE_TABLET | ORAL | Status: DC | PRN

## 2018-03-28 MED ORDER — OXYTOCIN 10 UNIT/ML IJ SOLN
10.0000 [IU] | Freq: Once | INTRAMUSCULAR | Status: AC
  Administered 2018-03-28: 10 [IU] via INTRAMUSCULAR

## 2018-03-28 MED ORDER — BENZOCAINE-MENTHOL 20-0.5 % EX AERO
1.0000 "application " | INHALATION_SPRAY | CUTANEOUS | Status: DC | PRN
  Administered 2018-03-28 – 2018-03-30 (×2): 1 via TOPICAL
  Filled 2018-03-28 (×2): qty 56

## 2018-03-28 MED ORDER — ONDANSETRON HCL 4 MG/2ML IJ SOLN: 4.0000 mg | INTRAMUSCULAR | Status: DC | PRN

## 2018-03-28 MED ORDER — OXYTOCIN 40 UNITS IN NORMAL SALINE INFUSION - SIMPLE MED: 2.5000 [IU]/h | INTRAVENOUS | Status: DC

## 2018-03-28 MED ORDER — MEASLES, MUMPS & RUBELLA VAC IJ SOLR: 0.5000 mL | Freq: Once | INTRAMUSCULAR | Status: DC

## 2018-03-28 MED ORDER — COCONUT OIL OIL
1.0000 "application " | TOPICAL_OIL | Status: DC | PRN
  Administered 2018-03-29: 1 via TOPICAL

## 2018-03-28 MED ORDER — TETANUS-DIPHTH-ACELL PERTUSSIS 5-2.5-18.5 LF-MCG/0.5 IM SUSP: 0.5000 mL | Freq: Once | INTRAMUSCULAR | Status: DC

## 2018-03-28 MED ORDER — LACTATED RINGERS IV SOLN: 500.0000 mL | INTRAVENOUS | Status: DC | PRN

## 2018-03-28 MED ORDER — FLEET ENEMA 7-19 GM/118ML RE ENEM: 1.0000 | ENEMA | RECTAL | Status: DC | PRN

## 2018-03-28 MED ORDER — SENNOSIDES-DOCUSATE SODIUM 8.6-50 MG PO TABS
2.0000 | ORAL_TABLET | ORAL | Status: DC
  Administered 2018-03-29 (×2): 2 via ORAL
  Filled 2018-03-28 (×2): qty 2

## 2018-03-28 MED ORDER — ACETAMINOPHEN 325 MG PO TABS: 650.0000 mg | ORAL_TABLET | ORAL | Status: DC | PRN

## 2018-03-28 MED ORDER — LACTATED RINGERS IV SOLN: INTRAVENOUS | Status: DC

## 2018-03-28 MED ORDER — OXYCODONE-ACETAMINOPHEN 5-325 MG PO TABS
2.0000 | ORAL_TABLET | ORAL | Status: DC | PRN
  Administered 2018-03-28: 2 via ORAL
  Filled 2018-03-28: qty 2

## 2018-03-28 MED ORDER — DIBUCAINE 1 % RE OINT: 1.0000 "application " | TOPICAL_OINTMENT | RECTAL | Status: DC | PRN

## 2018-03-28 MED ORDER — ACETAMINOPHEN 325 MG PO TABS
650.0000 mg | ORAL_TABLET | ORAL | Status: DC | PRN
  Administered 2018-03-28 – 2018-03-30 (×3): 650 mg via ORAL
  Filled 2018-03-28 (×3): qty 2

## 2018-03-28 MED ORDER — SOD CITRATE-CITRIC ACID 500-334 MG/5ML PO SOLN: 30.0000 mL | ORAL | Status: DC | PRN

## 2018-03-28 MED ORDER — ONDANSETRON HCL 4 MG PO TABS: 4.0000 mg | ORAL_TABLET | ORAL | Status: DC | PRN

## 2018-03-28 MED ORDER — IBUPROFEN 600 MG PO TABS
600.0000 mg | ORAL_TABLET | Freq: Four times a day (QID) | ORAL | Status: DC
  Administered 2018-03-28 – 2018-03-30 (×9): 600 mg via ORAL
  Filled 2018-03-28 (×10): qty 1

## 2018-03-28 MED ORDER — DIPHENHYDRAMINE HCL 25 MG PO CAPS: 25.0000 mg | ORAL_CAPSULE | Freq: Four times a day (QID) | ORAL | Status: DC | PRN

## 2018-03-28 MED ORDER — OXYTOCIN 10 UNIT/ML IJ SOLN
INTRAMUSCULAR | Status: AC
  Administered 2018-03-28: 10 [IU] via INTRAMUSCULAR
  Filled 2018-03-28: qty 1

## 2018-03-28 MED ORDER — OXYCODONE-ACETAMINOPHEN 5-325 MG PO TABS: 1.0000 | ORAL_TABLET | ORAL | Status: DC | PRN

## 2018-03-28 MED ORDER — PRENATAL MULTIVITAMIN CH
1.0000 | ORAL_TABLET | Freq: Every day | ORAL | Status: DC
  Administered 2018-03-29 – 2018-03-30 (×2): 1 via ORAL
  Filled 2018-03-28 (×3): qty 1

## 2018-03-28 MED ORDER — LIDOCAINE HCL (PF) 1 % IJ SOLN
INTRAMUSCULAR | Status: AC
  Administered 2018-03-28: 30 mL via SUBCUTANEOUS
  Filled 2018-03-28: qty 30

## 2018-03-28 MED ORDER — WITCH HAZEL-GLYCERIN EX PADS: 1.0000 "application " | MEDICATED_PAD | CUTANEOUS | Status: DC | PRN

## 2018-03-28 MED ORDER — ZOLPIDEM TARTRATE 5 MG PO TABS: 5.0000 mg | ORAL_TABLET | Freq: Every evening | ORAL | Status: DC | PRN

## 2018-03-28 NOTE — Lactation Note (Signed)
This note was copied from a baby's chart. Lactation Consultation Note  Patient Name: Misty Allen [id number] Date: 03/28/2018 Reason for consult: Initial assessment;Early term 57-38.6wks  [place] Arabic interpreter [phone] / dad also intervened with speaking  English with questions or responses.  Baby is 7 hours old / Early term/ LC explained to mom and dad the potential feeding Behaviors with the baby at this age. LC reassured mom and dad baby is off to a Excellent start latching. Feed 8-12 x's a day . Discussed nutritive vs non - nutritive  Feeding patterns and to watch for hanging out latched. The importance of STS feedings  Until the baby can stay awake for a feeding/ back to birth weight.  Prior to [facility] consult had been to the breast x 2 for 25 mins and 15 mins.  As LC entered the room mom was changing a large black diaper and LC assisted.  After diaper change baby was showing signs of hunger and LC offered to assist.  Mom receptive.  Mom and dad mentioned with their 1st baby baby was early, and didn't latch as well  As this baby is/ milk supply was low and and had to supplement.  @ this consult baby showing feeding cues and LC worked with mom on depth and  Pillow support / baby fed for 8 mins / and then re-latched deeper and fed for 20 mins/  Swallows noted and increased with compressions. Mom reported comfort with latch / and cramping.  Mother informed of post-discharge support and given phone number to the lactation department, including services for phone call assistance; out-patient appointments; and breastfeeding support group. List of other breastfeeding resources in the community given in the handout. Encouraged mother to call for problems or concerns related to breastfeeding.    Maternal Data Has patient been taught Hand Expression?: Yes Does the patient have breastfeeding experience prior to this delivery?: Yes  Feeding Feeding Type: Breast Fed(worked on depth and  multiple swallows noted )  LATCH Score Latch: Grasps breast easily, tongue down, lips flanged, rhythmical sucking.  Audible Swallowing: Spontaneous and intermittent  Type of Nipple: Everted at rest and after stimulation  Comfort (Breast/Nipple): Soft / non-tender  Hold (Positioning): Assistance needed to correctly position infant at breast and maintain latch.  LATCH Score: 9  Interventions Interventions: Breast feeding basics reviewed;Assisted with latch;Skin to skin;Breast massage;Breast compression;Adjust position;Support pillows;Position options  Lactation Tools Discussed/Used     Consult Status Consult Status: Follow-up Date: 03/29/18 Follow-up type: In-patient    Misty Allen Misty Allen 03/28/2018, 12:31 PM

## 2018-03-28 NOTE — MAU Note (Addendum)
Patient arrived in active labor.  Speaks arabic. Reports ctx back to back.  Last exam yesterday-fingertip.  No VB.  +FM.  SROM 2 hours ago.

## 2018-03-28 NOTE — Progress Notes (Addendum)
Nurse came to report at approx 0500hrs that patient has been passing a few medium sized clots and uterine tone was a bit boggy. Ordered 800 mcg Cytotec PR, Uterine tone increased and was firm with slowed PV bleeding. Patients vitals were stable and was not tachycardic.   BP 119/74 (BP Location: Right Arm)   Pulse 69   Temp 98.3 F (36.8 C) (Oral)   Resp 17   LMP 07/07/2017 (Exact Date)   SpO2 99%   Sandi Raveling, MD

## 2018-03-28 NOTE — H&P (Addendum)
OBSTETRIC ADMISSION HISTORY AND PHYSICAL  Misty Allen is a 33 y.o. female G2P1001 with IUP at [redacted]w[redacted]d presenting for SOL with ROM @ approx 0200 She is currently in active labour and appears to be pushing, Arabic speaking and would prefer a female provider. Dr Earlene Plater called to attend. She reports +Fms. No blurry vision, headaches, peripheral edema, or RUQ pain. She plans on Breat feeding. She requests No birth control.  Dating: By LMP --->  Estimated Date of Delivery: 04/13/18  Sono:    , CWD, normal anatomy, cephalic presentation, 342g, 62%ile   Prenatal History/Complications: Previous VAD  GBS Unknown   Past Medical History: Past Medical History:  Diagnosis Date  . Dyspnea     Past Surgical History: Past Surgical History:  Procedure Laterality Date  . NO PAST SURGERIES      Obstetrical History: OB History    Gravida  2   Para  1   Term  1   Preterm  0   AB  0   Living  1     SAB  0   TAB  0   Ectopic  0   Multiple  0   Live Births  1           Social History: Social History   Socioeconomic History  . Marital status: Married    Spouse name: Not on file  . Number of children: Not on file  . Years of education: Not on file  . Highest education level: Not on file  Occupational History  . Not on file  Social Needs  . Financial resource strain: Not on file  . Food insecurity:    Worry: Never true    Inability: Never true  . Transportation needs:    Medical: No    Non-medical: No  Tobacco Use  . Smoking status: Never Smoker  . Smokeless tobacco: Current User  . Tobacco comment: Hookah- stopped when pregnant  Substance and Sexual Activity  . Alcohol use: No  . Drug use: No  . Sexual activity: Yes    Birth control/protection: None  Lifestyle  . Physical activity:    Days per week: Not on file    Minutes per session: Not on file  . Stress: Not on file  Relationships  . Social connections:    Talks on phone: Not on file     Gets together: Not on file    Attends religious service: Not on file    Active member of club or organization: Not on file    Attends meetings of clubs or organizations: Not on file    Relationship status: Not on file  Other Topics Concern  . Not on file  Social History Narrative  . Not on file    Family History: Family History  Problem Relation Age of Onset  . Diabetes Mother     Allergies: Allergies  Allergen Reactions  . Bee Venom Shortness Of Breath and Swelling    SOB    Medications Prior to Admission  Medication Sig Dispense Refill Last Dose  . acetaminophen (TYLENOL) 500 MG tablet Take 500 mg by mouth every 6 (six) hours as needed for moderate pain.   Taking  . cyclobenzaprine (FLEXERIL) 10 MG tablet Take 1 tablet (10 mg total) by mouth 2 (two) times daily as needed for muscle spasms. 20 tablet 0 Not Taking  . Elastic Bandages & Supports (COMFORT FIT MATERNITY SUPP SM) MISC 1 Device by Does not apply route daily as  needed. 1 each 0 Taking  . Prenatal Vit-Fe Fumarate-FA (PREPLUS) 27-1 MG TABS TAKE ONE TABLET BY MOUTH ONCE DAILY 30 tablet 13 Taking  . terconazole (TERAZOL 7) 0.4 % vaginal cream Place 1 applicator vaginally at bedtime. (Patient not taking: Reported on 02/10/2018) 45 g 0 Not Taking     Review of Systems   All systems reviewed and negative except as stated in HPI  Blood pressure (!) 135/106, pulse (!) 116, last menstrual period 07/07/2017, currently breastfeeding. General appearance: alert, combative and distracted Lungs: regular rate and effort Heart: regular rate  Abdomen: soft, non-tender Extremities: Homans sign is negative, no sign of DVT Presentation: cephalic Fetal monitoringBaseline: 130 bpm, Variability: Good {> 6 bpm), Accelerations: Reactive and Decelerations: Early Uterine activity: UC started at 0200hrs Dilation: 6.5 Effacement (%): 80 Station: -2, -1 Exam by:: Latricia Heft, RN   Prenatal labs: ABO, Rh: O/Positive/-- (09/03  1431) Antibody: Negative (09/03 1431) Rubella: 9.48 (09/03 1431) RPR: Non Reactive (12/27 0939)  HBsAg: Negative (09/03 1431)  HIV: Non Reactive (12/27 0939)  GBS:   Unknown/ Pending  2 hr GTT 104/71/93  Nursing Staff Provider  Office Location  [facility] Dating   LMP   Language   Arabic Anatomy US  Normal   Flu Vaccine  01/10/18 Genetic Screen  NIPS: low risk, female    AFP: normal  TDaP vaccine   02/10/18 Hgb A1C or  GTT Early  Third trimester   Ref. Range 01/10/2018 09:39  Glucose, 1 hour Latest Ref Range: 65 - 179 mg/dL 017  Glucose, Fasting Latest Ref Range: 65 - 91 mg/dL 71  Glucose, 2 hour Latest Ref Range: 65 - 152 mg/dL 93    Rhogam   NA   LAB RESULTS   Feeding Plan Breast Blood Type O/Positive/-- (09/03 1431)   Contraception Does not wish to use any birth control  Antibody Negative (09/03 1431)  Circumcision Yes Rubella 9.48 (09/03 1431)  Pediatrician  Dr.Brisco([facility]) RPR Non Reactive (09/03 1431)   Support Person Jafar([facility]) HBsAg Negative (09/03 1431)   Prenatal Classes List given HIV Non Reactive (09/03 1431)  BTL Consent  GBS    VBAC Consent NA Pap 09/17/17: NIL, HPV neg    Hgb Electro  Normal     CF Neg for 32    SMA 2 gene copies     Waterbirth  [ ]  Class [ ]  Consent [ ]  CNM visit     Prenatal Transfer Tool  Maternal Diabetes: No Genetic Screening: Normal Maternal Ultrasounds/Referrals: Normal Fetal Ultrasounds or other Referrals:  None Maternal Substance Abuse:  No Significant Maternal Medications:  None Significant Maternal Lab Results: Lab values include: Other:  GBS pending  Results for orders placed or performed during the hospital encounter of 03/28/18 (from the past 24 hour(s))  [facility] Time: 03/28/18  4:23 AM  Result Value Ref Range   POCT Fern Test Positive = ruptured amniotic membanes     Patient Active Problem List   Diagnosis Date Noted  . Normal labor and delivery 03/28/2018  . Supervision of other normal pregnancy,  antepartum 09/17/2017  . Status post vacuum-assisted vaginal delivery 06/19/2015  . Language barrier, cultural differences 05/17/2015    Assessment: Misty Allen is a 33 y.o. female G2P1001 with IUP at [redacted]w[redacted]d presenting for SOL with ROM @ approx 0200 She is currently in active labour and appears to be pushing, Arabic speaking and would prefer a female provider  1. Labor: Active  2.  FWB: Cat 1 3. Pain: Mod-severe 4. GBS: Unknown/ Pending; no indications for empiric therapy    Plan: Admit to L&D  Expectant management of labour and delivery  Female only providers  Maternal fetal monitoring  Followup GBS results    Sandi Raveling, MD PGY 1 Family Medicine [facility]  03/28/2018, 4:26 AM   OB FELLOW HISTORY AND PHYSICAL ATTESTATION  I have seen and examined this patient; I agree with above documentation in the resident's note.   Marcy Siren, D.O. OB Fellow  03/28/2018, 4:49 AM

## 2018-03-28 NOTE — Progress Notes (Signed)
Interpretor Darl Pikes [phone] arabic

## 2018-03-29 ENCOUNTER — Encounter (HOSPITAL_COMMUNITY): Payer: Self-pay | Admitting: *Deleted

## 2018-03-29 LAB — RPR: RPR Ser Ql: NONREACTIVE

## 2018-03-29 NOTE — Progress Notes (Signed)
Misty Allen PostNatal Depression Scale: Nurse had brought in Arabic translation for mother. Apparently, she misunderstood several of the questions because of the particular dialect that the translation was in.  We went over the questions in English (dad interpreted for mom) and score changed significantly (and made more sense based on this nurse's interaction with family).

## 2018-03-29 NOTE — Lactation Note (Signed)
This note was copied from a baby's chart. Lactation Consultation Note: Video interpreter Emad [phone] used for my visit. Baby was on the mother's bed and she was in the bathroom when I went into room. Reviewed to put baby in bassinet when she goes to bathroom, not to leave him on her bed.  Mom reports that baby has been sleepy today. Reviewed unwrapping baby and nursing skin to skin. Mom able to hand express small drops of Colostrum. Concerned that it was clear and not white. Reassurance given. Baby latched well but needed some stimulation to continue nursing. Mom reports she is having trouble getting him to latch to right breast. Baby had nursed for 10 min on left breast. Baby latched easily to right breast. Reviewed wide open mouth and keeping him close to the breast throughout the feeding., Again needed some stimulation to continue nursing. Encouraged frequent nursing to promote milk supply. Still nursing as I left room. No questions at present. Encouraged to call for assist prn.  Patient Name: Misty Allen [id number] Date: 03/29/2018 Reason for consult: Follow-up assessment   Maternal Data Formula Feeding for Exclusion: Yes Reason for exclusion: Mother's choice to formula and breast feed on admission Has patient been taught Hand Expression?: Yes Does the patient have breastfeeding experience prior to this delivery?: Yes  Feeding Feeding Type: Breast Fed  LATCH Score Latch: Grasps breast easily, tongue down, lips flanged, rhythmical sucking.  Audible Swallowing: A few with stimulation  Type of Nipple: Everted at rest and after stimulation  Comfort (Breast/Nipple): Soft / non-tender  Hold (Positioning): Assistance needed to correctly position infant at breast and maintain latch.  LATCH Score: 8  Interventions Interventions: Breast feeding basics reviewed;Assisted with latch;Hand express;Breast compression;Support pillows  Lactation Tools Discussed/Used     Consult  Status Consult Status: Follow-up Date: 03/30/18 Follow-up type: In-patient    Pamelia Hoit 03/29/2018, 2:49 PM

## 2018-03-29 NOTE — Progress Notes (Signed)
POSTPARTUM PROGRESS NOTE  Post Partum Day 1  Subjective:  Misty Allen is a 33 y.o. G2B6389 s/p SVD at [redacted]w[redacted]d.  She reports she is doing well. No acute events overnight. She denies any problems with ambulating, voiding or po intake. Denies nausea or vomiting.  Pain is well controlled.  Lochia is mild to moderate. Per husband, patient has been having some abdominal pain consistent with upset stomach. Denies any nausea or vomiting.  Objective: Blood pressure (!) 106/59, pulse 70, temperature 98.5 F (36.9 C), temperature source Oral, resp. rate 18, last menstrual period 07/07/2017, SpO2 99 %, unknown if currently breastfeeding.  Physical Exam:  General: alert, cooperative and no distress Chest: no respiratory distress Heart:regular rate, distal pulses intact Abdomen: soft, nontender,  Uterine Fundus: firm, appropriately tender DVT Evaluation: No calf swelling or tenderness Extremities: no LE edema Skin: warm, dry  Recent Labs    03/28/18 1303  HGB 11.6*  HCT 34.6*    Assessment/Plan: Misty Allen is a 33 y.o. H7D4287 s/p SVD at [redacted]w[redacted]d   PPD#1 - Doing well  Routine postpartum care Contraception: None Feeding: Breast Dispo: Plan for discharge tomorrow given GBS unknown and baby needs to be monitored for 48 hours..   LOS: 1 day   Orpah Cobb, D.O. [facility], PGY1 03/29/2018, 12:31 PM

## 2018-03-30 NOTE — Lactation Note (Addendum)
This note was copied from a baby's chart. Lactation Consultation Note  Patient Name: Misty Allen [id number] Date: 03/30/2018 Reason for consult: Follow-up assessment;Difficult latch;Early term 55-38.6wks  P2 mother whose infant is now 107 hours old.  Mother's feeding choice on admission was breast/bottle.  Arabic interpreter (# [phone]) used for interpretation.  Mother's breasts are soft and non tender and nipples are everted and pink.  It appears like baby has not been latching deeply.  Mother's nipples are irritated and sore.  Baby was showing feeding cues.  I offered to assist with latching and mother accepted.  Mother prefers the cradle hold.  I suggested the cross cradle hold since this position is easier to keep a baby latched deeply.  Mother agreeable.  Demonstrated how to patiently wait for baby to open wide and to put him firmly into the breast tissue.  Mother observed how he had flanged lips and the entire areola in his mouth.  She denied pain with latching.  Showed her how to do intermittent breast compressions while baby fed.  Encouraged her to gently stimulate to keep him awake and eagerly sucking at the breast.  Baby fed for 15 minutes before mother allowed him to self release.  Upon releasing I showed her how to effectively burp baby.  Father had come to the room by this time and also assisted with the language barrier.  He reinforced the techniques I discussed and was a valuable asset in assisting mother.  After baby released from the first breast I suggested she place him in the football hold on the other breast to continue feeding.  Showed her proper hand and finger positioning and how to effectively maintain the deep latch in this position.  Mother continued to be concerned that baby may not be able to breathe but, after explanations by myself and father, mother seemed to relax slightly.  She needed reminders to keep her fingers away from the areola and to not compress breast  tissue near the areola.  Father is much more comfortable than mother with breast feeding and breast feeding issues.  He noted that he will be a good support person for her at home and can help her understand.  Baby continued feeding for another 10 minutes and was feeding when I left the room.  Engorgement prevention/treatment discussed.  Manual pump with instructions provided.  Mother has our OP phone number for questions/concerns after discharge.  RN updated.   Maternal Data Formula Feeding for Exclusion: Yes Reason for exclusion: Mother's choice to formula and breast feed on admission Has patient been taught Hand Expression?: Yes Does the patient have breastfeeding experience prior to this delivery?: Yes  Feeding Feeding Type: Breast Fed  LATCH Score Latch: Repeated attempts needed to sustain latch, nipple held in mouth throughout feeding, stimulation needed to elicit sucking reflex.  Audible Swallowing: A few with stimulation  Type of Nipple: Everted at rest and after stimulation  Comfort (Breast/Nipple): Soft / non-tender  Hold (Positioning): Assistance needed to correctly position infant at breast and maintain latch.  LATCH Score: 7  Interventions Interventions: Breast feeding basics reviewed;Assisted with latch;Skin to skin;Breast massage;Hand express;Breast compression;Coconut oil;Position options;Support pillows;Adjust position  Lactation Tools Discussed/Used WIC Program: No   Consult Status Consult Status: Complete Date: 03/30/18 Follow-up type: In-patient    [name] 03/30/2018, 11:47 AM

## 2018-03-30 NOTE — Discharge Instructions (Signed)
Vaginal Delivery, Care After °Refer to this sheet in the next few weeks. These instructions provide you with information about caring for yourself after vaginal delivery. Your health care provider may also give you more specific instructions. Your treatment has been planned according to current medical practices, but problems sometimes occur. Call your health care provider if you have any problems or questions. °What can I expect after the procedure? °After vaginal delivery, it is common to have: °· Some bleeding from your vagina. °· Soreness in your abdomen, your vagina, and the area of skin between your vaginal opening and your anus (perineum). °· Pelvic cramps. °· Fatigue. °Follow these instructions at home: °Medicines °· Take over-the-counter and prescription medicines only as told by your health care provider. °· If you were prescribed an antibiotic medicine, take it as told by your health care provider. Do not stop taking the antibiotic until it is finished. °Driving ° °· Do not drive or operate heavy machinery while taking prescription pain medicine. °· Do not drive for 24 hours if you received a sedative. °Lifestyle °· Do not drink alcohol. This is especially important if you are breastfeeding or taking medicine to relieve pain. °· Do not use tobacco products, including cigarettes, chewing tobacco, or e-cigarettes. If you need help quitting, ask your health care provider. °Eating and drinking °· Drink at least 8 eight-ounce glasses of water every day unless you are told not to by your health care provider. If you choose to breastfeed your baby, you may need to drink more water than this. °· Eat high-fiber foods every day. These foods may help prevent or relieve constipation. High-fiber foods include: °? Whole grain cereals and breads. °? Brown rice. °? Beans. °? Fresh fruits and vegetables. °Activity °· Return to your normal activities as told by your health care provider. Ask your health care provider what  activities are safe for you. °· Rest as much as possible. Try to rest or take a nap when your baby is sleeping. °· Do not lift anything that is heavier than your baby or 10 lb (4.5 kg) until your health care provider says that it is safe. °· Talk with your health care provider about when you can engage in sexual activity. This may depend on your: °? Risk of infection. °? Rate of healing. °? Comfort and desire to engage in sexual activity. °Vaginal Care °· If you have an episiotomy or a vaginal tear, check the area every day for signs of infection. Check for: °? More redness, swelling, or pain. °? More fluid or blood. °? Warmth. °? Pus or a bad smell. °· Do not use tampons or douches until your health care provider says this is safe. °· Watch for any blood clots that may pass from your vagina. These may look like clumps of dark red, brown, or black discharge. °General instructions °· Keep your perineum clean and dry as told by your health care provider. °· Wear loose, comfortable clothing. °· Wipe from front to back when you use the toilet. °· Ask your health care provider if you can shower or take a bath. If you had an episiotomy or a perineal tear during labor and delivery, your health care provider may tell you not to take baths for a certain length of time. °· Wear a bra that supports your breasts and fits you well. °· If possible, have someone help you with household activities and help care for your baby for at least a few days after you   leave the hospital. °· Keep all follow-up visits for you and your baby as told by your health care provider. This is important. °Contact a health care provider if: °· You have: °? Vaginal discharge that has a bad smell. °? Difficulty urinating. °? Pain when urinating. °? A sudden increase or decrease in the frequency of your bowel movements. °? More redness, swelling, or pain around your episiotomy or vaginal tear. °? More fluid or blood coming from your episiotomy or vaginal  tear. °? Pus or a bad smell coming from your episiotomy or vaginal tear. °? A fever. °? A rash. °? Little or no interest in activities you used to enjoy. °? Questions about caring for yourself or your baby. °· Your episiotomy or vaginal tear feels warm to the touch. °· Your episiotomy or vaginal tear is separating or does not appear to be healing. °· Your breasts are painful, hard, or turn red. °· You feel unusually sad or worried. °· You feel nauseous or you vomit. °· You pass large blood clots from your vagina. If you pass a blood clot from your vagina, save it to show to your health care provider. Do not flush blood clots down the toilet without having your health care provider look at them. °· You urinate more than usual. °· You are dizzy or light-headed. °· You have not breastfed at all and you have not had a menstrual period for 12 weeks after delivery. °· You have stopped breastfeeding and you have not had a menstrual period for 12 weeks after you stopped breastfeeding. °Get help right away if: °· You have: °? Pain that does not go away or does not get better with medicine. °? Chest pain. °? Difficulty breathing. °? Blurred vision or spots in your vision. °? Thoughts about hurting yourself or your baby. °· You develop pain in your abdomen or in one of your legs. °· You develop a severe headache. °· You faint. °· You bleed from your vagina so much that you fill two sanitary pads in one hour. °This information is not intended to replace advice given to you by your health care provider. Make sure you discuss any questions you have with your health care provider. °Document Released: 12/30/1999 Document Revised: 06/15/2015 Document Reviewed: 01/16/2015 °Elsevier Interactive Patient Education © 2019 Elsevier Inc. ° °

## 2018-03-30 NOTE — Discharge Summary (Signed)
OB Discharge Summary     Patient Name: Misty Allen DOB: 21-Feb-1985 MRN: [id number]  Date of admission: 03/28/2018 Delivering MD: Arvilla Market   Date of discharge: 03/30/2018  Admitting diagnosis: CTX Intrauterine pregnancy: [redacted]w[redacted]d     Secondary diagnosis:  Active Problems:   Normal labor and delivery  Additional problems: None      Discharge diagnosis: Term Pregnancy Delivered                                                                                                Post partum procedures:None  Augmentation: None  Complications: None  Hospital course:  Onset of Labor With Vaginal Delivery     33 y.o. yo [id number] at [redacted]w[redacted]d was admitted in Active Labor on 03/28/2018. Patient had an uncomplicated labor course as follows:  Membrane Rupture Time/Date: 4:20 AM ,03/28/2018   Intrapartum Procedures: Episiotomy: None [1]                                         Lacerations:  1st degree [2]  Patient had a precipitous delivery of a Viable infant. 03/28/2018  Information for the patient's newborn:  Deatrice, Mowdy [[id number]]  Delivery Method: Vag-Spont    Pateint had an uncomplicated postpartum course.  She is ambulating, tolerating a regular diet, passing flatus, and urinating well. Patient is discharged home in stable condition on 03/30/18.   Physical exam  Vitals:   03/29/18 0500 03/29/18 1453 03/29/18 2245 03/30/18 0603  BP: (!) 106/59 128/70 115/70 112/76  Pulse:  81 70 71  Resp: 18 17 18 18   Temp: 98.5 F (36.9 C) 98.3 F (36.8 C) 98.4 F (36.9 C) 98.2 F (36.8 C)  TempSrc: Oral Oral Oral Oral  SpO2:  99%     General: alert and cooperative Lochia: appropriate Uterine Fundus: firm Incision: N/A DVT Evaluation: No evidence of DVT seen on physical exam. Labs: Lab Results  Component Value Date   WBC 12.1 (H) 03/28/2018   HGB 11.6 (L) 03/28/2018   HCT 34.6 (L) 03/28/2018   MCV 85.4 03/28/2018   PLT 204 03/28/2018   CMP Latest Ref Rng &  Units 11/10/2014  Glucose 65 - 99 mg/dL 163(W)  BUN 6 - 20 mg/dL 12  Creatinine 4.66 - 5.99 mg/dL 3.57  Sodium 017 - 793 mmol/L 134(L)  Potassium 3.5 - 5.1 mmol/L 4.1  Chloride 101 - 111 mmol/L 107  CO2 22 - 32 mmol/L 24  Calcium 8.9 - 10.3 mg/dL 9.0(Z)    Discharge instruction: per After Visit Summary and "Baby and Me Booklet".  After visit meds:  Allergies as of 03/30/2018      Reactions   Bee Venom Shortness Of Breath, Swelling   SOB      Medication List    STOP taking these medications   PrePLUS 27-1 MG Tabs       Diet: routine diet  Activity: Advance as tolerated. Pelvic rest for 6 weeks.   Outpatient follow up:4 weeks Follow  up Appt: Future Appointments  Date Time Provider Department Center  04/03/2018  2:55 PM Rasch, Harolyn Rutherford, NP [facility]   Follow up Visit:No follow-ups on file.  Postpartum contraception: None  Newborn Data: Live born female  Birth Weight: 6 lb 15.8 oz (3170 g) APGAR: 8, 9  Newborn Delivery   Birth date/time:  03/28/2018 04:33:00 Delivery type:  Vaginal, Spontaneous     Baby Feeding: Breast Disposition:home with mother   03/30/2018 De Hollingshead, DO

## 2018-03-31 LAB — CULTURE, BETA STREP (GROUP B ONLY): Strep Gp B Culture: NEGATIVE

## 2018-04-03 ENCOUNTER — Encounter: Payer: Self-pay | Admitting: Obstetrics and Gynecology

## 2018-04-05 ENCOUNTER — Other Ambulatory Visit: Payer: Self-pay

## 2018-04-05 ENCOUNTER — Encounter (HOSPITAL_COMMUNITY): Payer: Self-pay | Admitting: *Deleted

## 2018-04-05 ENCOUNTER — Inpatient Hospital Stay (HOSPITAL_COMMUNITY)
Admission: AD | Admit: 2018-04-05 | Discharge: 2018-04-05 | Disposition: A | Discharge status: Home / Self Care | Payer: Medicaid Other | Attending: Obstetrics and Gynecology | Admitting: Obstetrics and Gynecology

## 2018-04-05 DIAGNOSIS — Z833 Family history of diabetes mellitus: Secondary | ICD-10-CM | POA: Diagnosis not present

## 2018-04-05 DIAGNOSIS — Z91030 Bee allergy status: Secondary | ICD-10-CM | POA: Insufficient documentation

## 2018-04-05 DIAGNOSIS — N939 Abnormal uterine and vaginal bleeding, unspecified: Secondary | ICD-10-CM

## 2018-04-05 NOTE — ED Provider Notes (Signed)
Pt was initially brought to the main ER. Seen by the charge RN who discussed with MAU, pt will be taken to MAU given recent vaginal delivery. MSE to be completed at MAU.  I did not see or evaluate the patient   Azalia Bilis, MD 04/05/18 2157

## 2018-04-05 NOTE — MAU Note (Signed)
SVD 03/28/18. Has had normal bleeding after delivery. This afternoon had bad cramping and passed a clot. Cramping better now but still present. Pain is in vagina and upper inner legs.

## 2018-04-05 NOTE — MAU Provider Note (Signed)
History     CSN: [id number]  Arrival date and time: 04/05/18 2110   First Provider Initiated Contact with Patient 04/05/18 2216      Chief Complaint  Patient presents with  . Vaginal Bleeding   HPI Misty Allen is a 33 y.o. [id number] postpartum patient who presents to MAU with chief complaint of new onset heavy vaginal bleeding. She reports seeing a clot early in the afternoon today (04/05/2018). She denies continuous heavy vaginal bleeding. She also denies dizziness, SOB, weakness or syncope.  She is s/p SVD over intact perineum on 03/28/2018. Her postpartum course was uncomplicated. She endorses doing house work since the day after discharge from the hospital.  OB History    Gravida  2   Para  2   Term  2   Preterm  0   AB  0   Living  2     SAB  0   TAB  0   Ectopic  0   Multiple  0   Live Births  2           Past Medical History:  Diagnosis Date  . Dyspnea   . Medical history non-contributory     Past Surgical History:  Procedure Laterality Date  . NO PAST SURGERIES      Family History  Problem Relation Age of Onset  . Diabetes Mother     Social History   Tobacco Use  . Smoking status: Never Smoker  . Smokeless tobacco: Current User  . Tobacco comment: Hookah- stopped when pregnant  Substance Use Topics  . Alcohol use: No  . Drug use: No    Allergies:  Allergies  Allergen Reactions  . Bee Venom Shortness Of Breath and Swelling    SOB    No medications prior to admission.    Review of Systems  Constitutional: Negative for chills, fatigue and fever.  Gastrointestinal: Negative for abdominal pain.  Genitourinary: Positive for vaginal bleeding. Negative for difficulty urinating, dysuria, flank pain, vaginal discharge and vaginal pain.  Musculoskeletal: Negative for back pain.  Neurological: Negative for dizziness, syncope, weakness, light-headedness and headaches.  All other systems reviewed and are negative.  Physical  Exam   Blood pressure 106/75, pulse 76, temperature 98.4 F (36.9 C), resp. rate 18, currently breastfeeding.  Physical Exam  Nursing note and vitals reviewed. Constitutional: She is oriented to person, place, and time. She appears well-developed and well-nourished.  Cardiovascular: Normal rate.  Respiratory: Effort normal.  GI: Soft. She exhibits no distension. There is no abdominal tenderness. There is no rebound and no guarding.  Genitourinary:    Vagina normal.     Genitourinary Comments: Small amount dark red vaginal discharge noted on SSE. Bimanual exam deferred    Musculoskeletal: Normal range of motion.  Neurological: She is alert and oriented to person, place, and time.  Skin: Skin is warm and dry.  Psychiatric: She has a normal mood and affect. Her behavior is normal. Judgment and thought content normal.   No signs of air hunger or dyspnea on exertion noted  MAU Course/MDM  Procedures  --Reviewed expectations for physical activity and spectrum of lochia in first six weeks postpartum.   Patient Vitals for the past 24 hrs:  BP Temp Pulse Resp  04/05/18 2251 116/67 - 69 18  04/05/18 2154 106/75 98.4 F (36.9 C) 76 18    Assessment and Plan  --33 y.o. G2P2002 10 days postpartum --Hemodynamically stable, asymptomatic --CBC declined by patient --Discharge home  in stable condition, return to MAU for new onset signs of increasing acuity  F/U: postpartum appt scheduled for 05/13/2018 at [facility]  Calvert Cantor 04/06/2018, 3:27 AM

## 2018-04-05 NOTE — Progress Notes (Signed)
Pt had desired spouse to interpret during visit. Uses [employer] to verify this with pt. Interpreter # [phone] used and pt signed waiver of right for free hospital interpreter services. SAm Weinhold CNM discussed d/c plan. Written and verbal d/c instructions given and understanding voiced

## 2018-04-05 NOTE — ED Notes (Signed)
Called MAU, patient to be transported to MAU.

## 2018-04-05 NOTE — Discharge Instructions (Signed)

## 2018-05-13 ENCOUNTER — Ambulatory Visit (INDEPENDENT_AMBULATORY_CARE_PROVIDER_SITE_OTHER): Payer: Medicaid Other

## 2018-05-13 ENCOUNTER — Other Ambulatory Visit: Payer: Self-pay

## 2018-05-13 NOTE — Progress Notes (Signed)
I connected with  Misty Allen on 05/13/18 at  3:35 PM EDT by telephone and verified that I am speaking with the correct person using two identifiers. And Interpreter [facility] assigned to patient.     I discussed the limitations, risks, security and privacy concerns of performing an evaluation and management service by telephone and the availability of in person appointments. I also discussed with the patient that there may be a patient responsible charge related to this service. The patient expressed understanding and agreed to proceed.  Ernestina Patches, CMA 05/13/2018  2:32 PM

## 2018-05-13 NOTE — Progress Notes (Signed)
TELEHEALTH VIRTUAL POSTPARTUM VISIT ENCOUNTER NOTE  I connected with@ on 05/13/18 at  3:35 PM EDT by  telephone at home and verified that I am speaking with the correct person using two identifiers.   I discussed the limitations, risks, security and privacy concerns of performing an evaluation and management service by telephone and the availability of in person appointments. I also discussed with the patient that there may be a patient responsible charge related to this service. The patient expressed understanding and agreed to proceed.  Appointment Date: 05/13/2018  [facility]  History of Present Illness: Isabelle Ah Rey is a 33 y.o.  B7S2831 (No LMP recorded.), seen for her postpartum visit. Her past medical history is significant for nothing   She is s/p normal spontaneous vaginal delivery on 03/28/18 at 37 weeks 5 days; she was discharged to home on PPD#2. Pregnancy complicated by nothing. Baby is doing well.  Complains of leg pain since delivery. She states it is in the tops of her thighs. Denies redness, swelling or tenderness with touching.   Vaginal bleeding or discharge: No  Mode of feeding infant: Breast Intercourse: Yes  Contraception: no method PP depression s/s: No .  Any bowel or bladder issues: Yes , reports constipation Pap smear: no abnormalities (date: 09/2017)  Review of Systems: Positive for constipation. Her 12 point review of systems is negative or as noted in the History of Present Illness.  Patient Active Problem List   Diagnosis Date Noted  . Normal labor and delivery 03/28/2018  . Supervision of other normal pregnancy, antepartum 09/17/2017  . Status post vacuum-assisted vaginal delivery 06/19/2015  . Language barrier, cultural differences 05/17/2015    Medications Karolyn A. Staebell had no medications administered during this visit. No current outpatient medications on file.   No current facility-administered medications for this visit.      Allergies Bee venom  Physical Exam:  General:  Alert, oriented and cooperative.   Mental Status: Normal mood and affect perceived. Normal judgment and thought content.  Rest of physical exam deferred due to type of encounter  PP Depression Screening:  negative  Assessment:Patient is a 33 y.o. D1V6160 who is 5 weeks postpartum from a normal spontaneous vaginal delivery.  She is doing well.   Plan:  1. Postpartum care and examination -Patient refused WebEx visit but agreed to telephone. Very upset that it could not be in person visit. Reasons for virtual visit reviewed with patient. Patient verbalized understanding but upset. -Constipation and hemorrhoid relief reviewed with patient -Encouraged patient to slowly increase activity daily to help with constipation and leg pain. Encouraged tylenol or ibuprofen to help with leg soreness. -Patient concerned about milk supply but infant showing signs of satiety. Reviewed normal supply and demand of breastmilk and encouraged patient to let infant nurse when needed. Infant doing well and gaining weight, no concern of low supply at this time. Encouraged patient to make an appointment with lactation if she continues to have concerns   RTC 1 year for annual exam or sooner if needed  I discussed the assessment and treatment plan with the patient. The patient was provided an opportunity to ask questions and all were answered. The patient agreed with the plan and demonstrated an understanding of the instructions.   The patient was advised to call back or seek an in-person evaluation/go to the ED for any concerning postpartum symptoms.  I provided 25 minutes of non-face-to-face time during this encounter.   Rolm Bookbinder,  [place] for [employer], [place]

## 2018-08-07 NOTE — Progress Notes (Signed)
Shortness of breath and rash all over body. Benadryl has not helped the rash.  Experiences menstrual cramps 2x a month.

## 2018-10-21 ENCOUNTER — Other Ambulatory Visit: Payer: Self-pay

## 2018-10-21 DIAGNOSIS — R0789 Other chest pain: Secondary | ICD-10-CM | POA: Insufficient documentation

## 2018-10-22 ENCOUNTER — Other Ambulatory Visit: Payer: Self-pay

## 2018-10-22 ENCOUNTER — Emergency Department (HOSPITAL_COMMUNITY): Payer: Medicaid Other

## 2018-10-22 ENCOUNTER — Emergency Department (HOSPITAL_COMMUNITY)
Admission: EM | Admit: 2018-10-22 | Discharge: 2018-10-22 | Disposition: A | Discharge status: Home / Self Care | Payer: Medicaid Other | Attending: Emergency Medicine | Admitting: Emergency Medicine

## 2018-10-22 ENCOUNTER — Encounter: Payer: Self-pay | Admitting: Emergency Medicine

## 2018-10-22 DIAGNOSIS — R0789 Other chest pain: Secondary | ICD-10-CM

## 2018-10-22 LAB — BASIC METABOLIC PANEL
Anion gap: 8 (ref 5–15)
BUN: 23 mg/dL — ABNORMAL HIGH (ref 6–20)
CO2: 25 mmol/L (ref 22–32)
Calcium: 9.2 mg/dL (ref 8.9–10.3)
Chloride: 108 mmol/L (ref 98–111)
Creatinine, Ser: 0.81 mg/dL (ref 0.44–1.00)
GFR calc Af Amer: >60 mL/min (ref >60)
GFR calc non Af Amer: >60 mL/min (ref >60)
Glucose, Bld: 91 mg/dL (ref 70–99)
Potassium: 4.2 mmol/L (ref 3.5–5.1)
Sodium: 141 mmol/L (ref 135–145)

## 2018-10-22 LAB — CBC
HCT: 42 % (ref 36.0–46.0)
Hemoglobin: 14.1 g/dL (ref 12.0–15.0)
MCH: 28 pg (ref 26.0–34.0)
MCHC: 33.6 g/dL (ref 30.0–36.0)
MCV: 83.5 fL (ref 80.0–100.0)
Platelets: 290 10*3/uL (ref 150–400)
RBC: 5.03 MIL/uL (ref 3.87–5.11)
RDW: 12.8 % (ref 11.5–15.5)
WBC: 6.7 10*3/uL (ref 4.0–10.5)
nRBC: 0 % (ref 0.0–0.2)

## 2018-10-22 LAB — I-STAT BETA HCG BLOOD, ED (NOT ORDERABLE): I-stat hCG, quantitative: <5 m[IU]/mL (ref <5)

## 2018-10-22 LAB — TROPONIN I (HIGH SENSITIVITY)
Troponin I (High Sensitivity): <2 ng/L (ref <18)
Troponin I (High Sensitivity): <2 ng/L (ref <18)

## 2018-10-22 MED ORDER — KETOROLAC TROMETHAMINE 60 MG/2ML IM SOLN
30.0000 mg | Freq: Once | INTRAMUSCULAR | Status: AC
  Administered 2018-10-22: 05:00:00 30 mg via INTRAMUSCULAR
  Filled 2018-10-22: qty 2

## 2018-10-22 MED ORDER — SODIUM CHLORIDE 0.9% FLUSH
3.0000 mL | Freq: Once | INTRAVENOUS | Status: AC
  Administered 2018-10-22: 04:00:00 3 mL via INTRAVENOUS

## 2018-10-22 NOTE — ED Provider Notes (Signed)
[place] DEPT Provider Note  CSN: [id number] Arrival date & time: 10/21/18 2323  Chief Complaint(s) Chest Pain  HPI Misty Allen is a 33 y.o. female    Chest Pain Pain location:  Substernal area Pain quality: pressure   Pain severity:  Moderate Onset quality:  Gradual Duration:  1 week Timing:  Constant Progression:  Worsening Chronicity:  New Relieved by:  Nothing Worsened by:  Movement and deep breathing Associated symptoms: cough (dry)   Associated symptoms: no abdominal pain, no anxiety, no fever, no nausea and no vomiting   Risk factors: no birth control, no coronary artery disease, no diabetes mellitus, no high cholesterol, no hypertension, no immobilization, not female, no prior DVT/PE and no smoking     Past Medical History Past Medical History:  Diagnosis Date  . Dyspnea   . Medical history non-contributory    Patient Active Problem List   Diagnosis Date Noted  . Normal labor and delivery 03/28/2018  . Supervision of other normal pregnancy, antepartum 09/17/2017  . Status post vacuum-assisted vaginal delivery 06/19/2015  . Language barrier, cultural differences 05/17/2015   Home Medication(s) Prior to Admission medications   Not on File                                                                                                                                    Past Surgical History Past Surgical History:  Procedure Laterality Date  . NO PAST SURGERIES     Family History Family History  Problem Relation Age of Onset  . Diabetes Mother     Social History Social History   Tobacco Use  . Smoking status: Never Smoker  . Smokeless tobacco: Current User  . Tobacco comment: Hookah- stopped when pregnant  Substance Use Topics  . Alcohol use: No  . Drug use: No   Allergies Bee venom  Review of Systems Review of Systems  Constitutional: Negative for fever.  Respiratory: Positive for cough (dry).    Cardiovascular: Positive for chest pain.  Gastrointestinal: Negative for abdominal pain, nausea and vomiting.   All other systems are reviewed and are negative for acute change except as noted in the HPI  Physical Exam Vital Signs  I have reviewed the triage vital signs BP 110/70 (BP Location: Left Arm)   Pulse 66   Temp 98.1 F (36.7 C) (Oral)   Resp 18   Ht 4' 11.06" (1.5 m)   SpO2 100%   Breastfeeding Yes   BMI 34.84 kg/m   Physical Exam Vitals signs reviewed.  Constitutional:      General: She is not in acute distress.    Appearance: She is well-developed. She is not diaphoretic.  HENT:     Head: Normocephalic and atraumatic.     Nose: Nose normal.  Eyes:     General: No scleral icterus.       Right eye: No discharge.  Left eye: No discharge.     Conjunctiva/sclera: Conjunctivae normal.     Pupils: Pupils are equal, round, and reactive to light.  Neck:     Musculoskeletal: Normal range of motion and neck supple.  Cardiovascular:     Rate and Rhythm: Normal rate and regular rhythm.     Heart sounds: No murmur. No friction rub. No gallop.   Pulmonary:     Effort: Pulmonary effort is normal. No respiratory distress.     Breath sounds: Normal breath sounds. No stridor. No rales.    Chest:     Chest wall: Tenderness present.    Abdominal:     General: There is no distension.     Palpations: Abdomen is soft.     Tenderness: There is no abdominal tenderness.  Musculoskeletal:        General: No tenderness.  Skin:    General: Skin is warm and dry.     Findings: No erythema or rash.  Neurological:     Mental Status: She is alert and oriented to person, place, and time.     ED Results and Treatments Labs (all labs ordered are listed, but only abnormal results are displayed) Labs Reviewed  BASIC METABOLIC PANEL - Abnormal; Notable for the following components:      Result Value   BUN 23 (*)    All other components within normal limits  CBC  I-STAT  BETA HCG BLOOD, ED ([facility], [facility], [facility] ONLY)  I-STAT BETA HCG BLOOD, ED (NOT ORDERABLE)  TROPONIN I (HIGH SENSITIVITY)  TROPONIN I (HIGH SENSITIVITY)                                                                                                                         EKG  EKG Interpretation  Date/Time:  Wednesday October 22 2018 03:39:01 EDT Ventricular Rate:  59 PR Interval:    QRS Duration: 98 QT Interval:  416 QTC Calculation: 413 R Axis:   66 Text Interpretation:  Sinus rhythm Low voltage, precordial leads NO STEMI. No old tracing to compare Confirmed by Drema Pry [phone]) on 10/22/2018 4:26:34 AM      Radiology Dg Chest 2 View  Result Date: 10/22/2018 CLINICAL DATA:  Chest pain EXAM: CHEST - 2 VIEW COMPARISON:  None. FINDINGS: The heart size and mediastinal contours are within normal limits. Both lungs are clear. The visualized skeletal structures are unremarkable. IMPRESSION: No acute cardiopulmonary process. Electronically Signed   By: Jonna Clark M.D.   On: 10/22/2018 00:53    Pertinent labs & imaging results that were available during my care of the patient were reviewed by me and considered in my medical decision making (see chart for details).  Medications Ordered in ED Medications  sodium chloride flush (NS) 0.9 % injection 3 mL (3 mLs Intravenous Given by Other 10/22/18 0344)  ketorolac (TORADOL) injection 30 mg (30 mg Intramuscular Given 10/22/18 0449)  Procedures Procedures  (including critical care time)  Medical Decision Making / ED Course I have reviewed the nursing notes for this encounter and the patient's prior records (if available in EHR or on provided paperwork).   ZOXWRWafaa Ah Ainsley SpinnerBaniowda was evaluated in Emergency Department on 10/22/2018 for the symptoms described in the history of present illness. She was evaluated in the context of  the global COVID-19 pandemic, which necessitated consideration that the patient might be at risk for infection with the SARS-CoV-2 virus that causes COVID-19. Institutional protocols and algorithms that pertain to the evaluation of patients at risk for COVID-19 are in a state of rapid change based on information released by regulatory bodies including the CDC and federal and state organizations. These policies and algorithms were followed during the patient's care in the ED.  1 week of persistent right-sided chest pain.  Most suspicious for chest wall pain -likely muscle strain.  Highly inconsistent with ACS.  EKG without acute ischemic changes or evidence of pericarditis.  Troponin negative.   Low suspicion for pulmonary embolism; PERC negative.  Presentation not classic for aortic dissection or esophageal perforation.  Chest x-ray without evidence suggestive of pneumonia, pneumothorax, pneumomediastinum.  No abnormal contour of the mediastinum to suggest dissection. No evidence of acute injuries.  Treated with IM toradol.   The patient appears reasonably screened and/or stabilized for discharge and I doubt any other medical condition or other [facility] requiring further screening, evaluation, or treatment in the ED at this time prior to discharge.  The patient is safe for discharge with strict return precautions.       Final Clinical Impression(s) / ED Diagnoses Final diagnoses:  Chest wall pain    The patient appears reasonably screened and/or stabilized for discharge and I doubt any other medical condition or other [facility] requiring further screening, evaluation, or treatment in the ED at this time prior to discharge.  Disposition: Discharge  Condition: Good  I have discussed the results, Dx and Tx plan with the patient who expressed understanding and agree(s) with the plan. Discharge instructions discussed at great length. The patient was given strict return precautions who verbalized  understanding of the instructions. No further questions at time of discharge.    ED Discharge Orders    None        Follow Up: Macy MisBriscoe, Kim K, MD [address] [place] [id number] [phone]  Schedule an appointment as soon as possible for a visit        This chart was dictated using voice recognition software.  Despite best efforts to proofread,  errors can occur which can change the documentation meaning.   Nira Connardama, Pedro Eduardo, MD 10/22/18 [phone]

## 2018-10-22 NOTE — ED Triage Notes (Signed)
Patient is complaining of mid sternal chest pain, shoulders, and index finger. Pain started a week ago and described like a heavy rock sitting on her chest. Patient states it is hard for her to pick up things and hold things with her right arm. Associated symptoms of shortness of breath. Patient talking in Arabic with no complications. Skin is warm and dry. Respirations are even, regular, and unlabored.

## 2018-10-22 NOTE — Discharge Instructions (Addendum)
You may use over-the-counter Motrin (Ibuprofen), Acetaminophen (Tylenol), topical muscle creams such as SalonPas, [employer], Bengay, etc. Please stretch, apply heat, and have massage therapy for additional assistance.  ????? ??????? ?????? (??????????) ? ????????????? (????????) ? ??????? ?????? ??????? ??? SalonPas ? ? Icy Hot ? ? Bengay ? ??? ??? ???. ????? ?????? ???????? ??????? ??????? ??? ???? ???????? ?????? ??? ?????? ??????.

## 2018-11-19 ENCOUNTER — Other Ambulatory Visit: Payer: Self-pay

## 2018-11-19 ENCOUNTER — Encounter (HOSPITAL_BASED_OUTPATIENT_CLINIC_OR_DEPARTMENT_OTHER): Payer: Self-pay | Admitting: Emergency Medicine

## 2018-11-19 ENCOUNTER — Emergency Department (HOSPITAL_BASED_OUTPATIENT_CLINIC_OR_DEPARTMENT_OTHER)
Admission: EM | Admit: 2018-11-19 | Discharge: 2018-11-19 | Disposition: A | Discharge status: Home / Self Care | Payer: Medicaid Other | Attending: Emergency Medicine | Admitting: Emergency Medicine

## 2018-11-19 DIAGNOSIS — F17290 Nicotine dependence, other tobacco product, uncomplicated: Secondary | ICD-10-CM | POA: Insufficient documentation

## 2018-11-19 DIAGNOSIS — R21 Rash and other nonspecific skin eruption: Secondary | ICD-10-CM | POA: Diagnosis present

## 2018-11-19 DIAGNOSIS — T63441A Toxic effect of venom of bees, accidental (unintentional), initial encounter: Secondary | ICD-10-CM | POA: Insufficient documentation

## 2018-11-19 MED ORDER — FAMOTIDINE 20 MG PO TABS
40.0000 mg | ORAL_TABLET | Freq: Once | ORAL | Status: AC
  Administered 2018-11-19: 18:00:00 40 mg via ORAL
  Filled 2018-11-19: qty 2

## 2018-11-19 MED ORDER — DIPHENHYDRAMINE HCL 25 MG PO CAPS
50.0000 mg | ORAL_CAPSULE | Freq: Once | ORAL | Status: AC
  Administered 2018-11-19: 18:00:00 50 mg via ORAL
  Filled 2018-11-19: qty 2

## 2018-11-19 MED ORDER — METHYLPREDNISOLONE SODIUM SUCC 125 MG IJ SOLR
INTRAMUSCULAR | Status: AC
  Filled 2018-11-19: qty 2

## 2018-11-19 MED ORDER — FAMOTIDINE IN NACL 20-0.9 MG/50ML-% IV SOLN
INTRAVENOUS | Status: AC
  Filled 2018-11-19: qty 50

## 2018-11-19 MED ORDER — PREDNISONE 20 MG PO TABS: ORAL_TABLET | ORAL | 0 refills | Status: DC

## 2018-11-19 MED ORDER — EPINEPHRINE 0.3 MG/0.3ML IJ SOAJ
INTRAMUSCULAR | Status: AC
  Filled 2018-11-19: qty 0.3

## 2018-11-19 MED ORDER — KETOROLAC TROMETHAMINE 15 MG/ML IJ SOLN
15.0000 mg | Freq: Once | INTRAMUSCULAR | Status: DC
  Filled 2018-11-19: qty 1

## 2018-11-19 MED ORDER — ACETAMINOPHEN 325 MG PO TABS
650.0000 mg | ORAL_TABLET | Freq: Once | ORAL | Status: AC
  Administered 2018-11-19: 18:00:00 650 mg via ORAL
  Filled 2018-11-19: qty 2

## 2018-11-19 MED ORDER — PREDNISONE 50 MG PO TABS
60.0000 mg | ORAL_TABLET | Freq: Once | ORAL | Status: AC
  Administered 2018-11-19: 60 mg via ORAL
  Filled 2018-11-19: qty 1

## 2018-11-19 MED ORDER — DIPHENHYDRAMINE HCL 50 MG/ML IJ SOLN
INTRAMUSCULAR | Status: AC
  Filled 2018-11-19: qty 1

## 2018-11-19 NOTE — ED Triage Notes (Addendum)
Wasp sting to R hand x 1 hour ago. Swelling noted. Also has hives throughout body. Swelling noted to lower lip. Denies SOB

## 2018-11-19 NOTE — Discharge Instructions (Signed)
Follow up with your doctor.  Take the steroid as prescribed.  Take benadryl for itching. Return for worsening rash, shortness of breath, vomiting, diarrhea or if you pass out.

## 2018-11-19 NOTE — ED Provider Notes (Signed)
[facility] EMERGENCY DEPARTMENT Provider Note   CSN: [id number] Arrival date & time: 11/19/18  1746     History   Chief Complaint Chief Complaint  Patient presents with  . Allergic Reaction    HPI Misty Allen is a 33 y.o. female.     33 yo F with a chief complaints of a bee sting.  Patient was stung on both sides of her neck.  This occurred about an hour ago.  Patient has had some itching to the area and has been uncomfortable.  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  She denies any vomiting any lightheadedness any diarrhea.  Denies any shortness of breath.  The history is provided by the patient and a relative. The history is limited by a language barrier. A language interpreter was used.  Allergic Reaction Presenting symptoms: itching and rash   Presenting symptoms: no wheezing   Severity:  Moderate Duration:  1 hour Prior allergic episodes:  Insect allergies Context: insect bite/sting   Relieved by:  Nothing Worsened by:  Nothing Ineffective treatments:  None tried   Past Medical History:  Diagnosis Date  . Dyspnea   . Medical history non-contributory     Patient Active Problem List   Diagnosis Date Noted  . Normal labor and delivery 03/28/2018  . Supervision of other normal pregnancy, antepartum 09/17/2017  . Status post vacuum-assisted vaginal delivery 06/19/2015  . Language barrier, cultural differences 05/17/2015    Past Surgical History:  Procedure Laterality Date  . NO PAST SURGERIES       OB History    Gravida  2   Para  2   Term  2   Preterm  0   AB  0   Living  2     SAB  0   TAB  0   Ectopic  0   Multiple  0   Live Births  2            Home Medications    Prior to Admission medications   Medication Sig Start Date End Date Taking? Authorizing Provider  predniSONE (DELTASONE) 20 MG tablet 2 tabs po daily x 4 days 11/19/18   Melene Plan, DO     Family History Family History  Problem Relation Age of Onset  . Diabetes Mother     Social History Social History   Tobacco Use  . Smoking status: Never Smoker  . Smokeless tobacco: Current User  . Tobacco comment: Hookah- stopped when pregnant  Substance Use Topics  . Alcohol use: No  . Drug use: No     Allergies   Bee venom   Review of Systems Review of Systems  Constitutional: Negative for chills and fever.  HENT: Negative for congestion and rhinorrhea.   Eyes: Negative for redness and visual disturbance.  Respiratory: Negative for shortness of breath and wheezing.   Cardiovascular: Negative for chest pain and palpitations.  Gastrointestinal: Negative for nausea and vomiting.  Genitourinary: Negative for dysuria and urgency.  Musculoskeletal: Negative for arthralgias and myalgias.  Skin: Positive for itching and rash. Negative for pallor and wound.  Neurological: Negative for dizziness and headaches.     Physical Exam Updated Vital Signs BP 114/67 (BP Location: Right Arm)   Pulse 66   Temp 98.3 F (36.8 C) (Oral)   Resp 20   SpO2 100%   Breastfeeding Yes   Physical Exam Vitals signs and nursing note  reviewed.  Constitutional:      General: She is not in acute distress.    Appearance: She is well-developed. She is not diaphoretic.  HENT:     Head: Normocephalic and atraumatic.     Comments: Hives noted to the lateral aspect of the neck bilaterally.  No diffuse rash.  Eyes:     Pupils: Pupils are equal, round, and reactive to light.  Neck:     Musculoskeletal: Normal range of motion and neck supple.  Cardiovascular:     Rate and Rhythm: Normal rate and regular rhythm.     Heart sounds: No murmur. No friction rub. No gallop.   Pulmonary:     Effort: Pulmonary effort is normal.     Breath sounds: No wheezing or rales.  Abdominal:     General: There is no distension.     Palpations: Abdomen is soft.     Tenderness: There is no abdominal tenderness.   Musculoskeletal:        General: Swelling present. No tenderness.     Comments: Edema also to the right hand.  Skin:    General: Skin is warm and dry.  Neurological:     Mental Status: She is alert and oriented to person, place, and time.  Psychiatric:        Behavior: Behavior normal.      ED Treatments / Results  Labs (all labs ordered are listed, but only abnormal results are displayed) Labs Reviewed - No data to display  EKG None  Radiology No results found.  Procedures Procedures (including critical care time)  Medications Ordered in ED Medications  diphenhydrAMINE (BENADRYL) capsule 50 mg (50 mg Oral Given 11/19/18 1804)  famotidine (PEPCID) tablet 40 mg (40 mg Oral Given 11/19/18 1804)  predniSONE (DELTASONE) tablet 60 mg (60 mg Oral Given 11/19/18 1805)  acetaminophen (TYLENOL) tablet 650 mg (650 mg Oral Given 11/19/18 1818)     Initial Impression / Assessment and Plan / ED Course  I have reviewed the triage vital signs and the nursing notes.  Pertinent labs & imaging results that were available during my care of the patient were reviewed by me and considered in my medical decision making (see chart for details).        33 yo F with a chief complaints of a bee sting.  Has known allergy to bees.  The patient has no 2 systems involved to be treated with epinephrine.  She has localized reaction to a bee sting.  She does appear to be somewhat uncomfortable from this and there is a language barrier.  I will give prednisone H2 blockers and observe in the ED.  Patient reassessed and feeling much better.  Swelling seems to have improved significantly.  Will discharge at this time.  PCP follow-up.  8:08 PM:  I have discussed the diagnosis/risks/treatment options with the patient and family and believe the pt to be eligible for discharge home to follow-up with PCP. We also discussed returning to the ED immediately if new or worsening sx occur. We discussed the sx which are  most concerning (e.g., sudden worsening rash, sob, syncope, n/v/d) that necessitate immediate return. Medications administered to the patient during their visit and any new prescriptions provided to the patient are listed below.  Medications given during this visit Medications  diphenhydrAMINE (BENADRYL) capsule 50 mg (50 mg Oral Given 11/19/18 1804)  famotidine (PEPCID) tablet 40 mg (40 mg Oral Given 11/19/18 1804)  predniSONE (DELTASONE) tablet 60 mg (60 mg  Oral Given 11/19/18 1805)  acetaminophen (TYLENOL) tablet 650 mg (650 mg Oral Given 11/19/18 1818)     The patient appears reasonably screen and/or stabilized for discharge and I doubt any other medical condition or other [facility] requiring further screening, evaluation, or treatment in the ED at this time prior to discharge.    Final Clinical Impressions(s) / ED Diagnoses   Final diagnoses:  Bee sting reaction, accidental or unintentional, initial encounter    ED Discharge Orders         Ordered    predniSONE (DELTASONE) 20 MG tablet     11/19/18 2007           Deno Etienne, DO 11/19/18 2008

## 2019-07-06 ENCOUNTER — Emergency Department (HOSPITAL_BASED_OUTPATIENT_CLINIC_OR_DEPARTMENT_OTHER)
Admission: EM | Admit: 2019-07-06 | Discharge: 2019-07-06 | Disposition: A | Discharge status: Home / Self Care | Payer: Medicaid Other | Attending: Emergency Medicine | Admitting: Emergency Medicine

## 2019-07-06 ENCOUNTER — Other Ambulatory Visit: Payer: Self-pay

## 2019-07-06 ENCOUNTER — Encounter (HOSPITAL_BASED_OUTPATIENT_CLINIC_OR_DEPARTMENT_OTHER): Payer: Self-pay

## 2019-07-06 DIAGNOSIS — T7840XA Allergy, unspecified, initial encounter: Secondary | ICD-10-CM

## 2019-07-06 DIAGNOSIS — T63441A Toxic effect of venom of bees, accidental (unintentional), initial encounter: Secondary | ICD-10-CM | POA: Insufficient documentation

## 2019-07-06 LAB — PREGNANCY, URINE: Preg Test, Ur: NEGATIVE

## 2019-07-06 LAB — CBC
HCT: 47.5 % — ABNORMAL HIGH (ref 36.0–46.0)
Hemoglobin: 15.9 g/dL — ABNORMAL HIGH (ref 12.0–15.0)
MCH: 28.1 pg (ref 26.0–34.0)
MCHC: 33.5 g/dL (ref 30.0–36.0)
MCV: 84.1 fL (ref 80.0–100.0)
Platelets: 298 10*3/uL (ref 150–400)
RBC: 5.65 MIL/uL — ABNORMAL HIGH (ref 3.87–5.11)
RDW: 12.9 % (ref 11.5–15.5)
WBC: 5.8 10*3/uL (ref 4.0–10.5)
nRBC: 0 % (ref 0.0–0.2)

## 2019-07-06 LAB — BASIC METABOLIC PANEL
Anion gap: 10 (ref 5–15)
BUN: 16 mg/dL (ref 6–20)
CO2: 23 mmol/L (ref 22–32)
Calcium: 9.1 mg/dL (ref 8.9–10.3)
Chloride: 106 mmol/L (ref 98–111)
Creatinine, Ser: 0.56 mg/dL (ref 0.44–1.00)
GFR calc Af Amer: >60 mL/min (ref >60)
GFR calc non Af Amer: >60 mL/min (ref >60)
Glucose, Bld: 103 mg/dL — ABNORMAL HIGH (ref 70–99)
Potassium: 4.1 mmol/L (ref 3.5–5.1)
Sodium: 139 mmol/L (ref 135–145)

## 2019-07-06 MED ORDER — METHYLPREDNISOLONE SODIUM SUCC 125 MG IJ SOLR
INTRAMUSCULAR | Status: AC
  Administered 2019-07-06: 125 mg via INTRAVENOUS
  Filled 2019-07-06: qty 2

## 2019-07-06 MED ORDER — EPINEPHRINE 0.3 MG/0.3ML IJ SOAJ
0.3000 mg | Freq: Once | INTRAMUSCULAR | Status: AC
Start: 2019-07-06 — End: 2019-07-06

## 2019-07-06 MED ORDER — FAMOTIDINE IN NACL 20-0.9 MG/50ML-% IV SOLN
INTRAVENOUS | Status: AC
  Administered 2019-07-06: 20 mg via INTRAVENOUS
  Filled 2019-07-06: qty 50

## 2019-07-06 MED ORDER — DIPHENHYDRAMINE HCL 50 MG/ML IJ SOLN: 25.0000 mg | Freq: Once | INTRAMUSCULAR | Status: AC

## 2019-07-06 MED ORDER — FAMOTIDINE IN NACL 20-0.9 MG/50ML-% IV SOLN
20.0000 mg | Freq: Once | INTRAVENOUS | Status: AC
Start: 2019-07-06 — End: 2019-07-06

## 2019-07-06 MED ORDER — DIPHENHYDRAMINE HCL 50 MG/ML IJ SOLN
INTRAMUSCULAR | Status: AC
  Administered 2019-07-06: 25 mg via INTRAVENOUS
  Filled 2019-07-06: qty 1

## 2019-07-06 MED ORDER — EPINEPHRINE 0.3 MG/0.3ML IJ SOAJ
INTRAMUSCULAR | Status: AC
  Administered 2019-07-06: 0.3 mg via INTRAMUSCULAR
  Filled 2019-07-06: qty 0.3

## 2019-07-06 MED ORDER — SODIUM CHLORIDE 0.9 % IV BOLUS
1000.0000 mL | Freq: Once | INTRAVENOUS | Status: AC
  Administered 2019-07-06: 1000 mL via INTRAVENOUS

## 2019-07-06 MED ORDER — METHYLPREDNISOLONE SODIUM SUCC 125 MG IJ SOLR: 125.0000 mg | Freq: Once | INTRAMUSCULAR | Status: AC

## 2019-07-06 MED ORDER — DIPHENHYDRAMINE HCL 25 MG PO TABS: 25.0000 mg | ORAL_TABLET | Freq: Four times a day (QID) | ORAL | 0 refills | Status: DC | PRN

## 2019-07-06 MED ORDER — EPINEPHRINE 0.3 MG/0.3ML IJ SOAJ: 0.3000 mg | INTRAMUSCULAR | 1 refills | Status: AC | PRN

## 2019-07-06 NOTE — ED Provider Notes (Signed)
[facility] EMERGENCY DEPARTMENT Provider Note   CSN: [id number] Arrival date & time: 07/06/19  1142     History Chief Complaint  Patient presents with  . Allergic Reaction    Misty Allen is a 34 y.o. 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  The patient is primarily Arabic speaking but her husband is able to translate at bedside.  Her husband tells me that the patient was stung on the tongue by a bee approximately 30 minutes prior to arrival.  She did not take any medications at home.  Patient arrives complaining of lip swelling and stating her tongue feels swollen and she is having difficulty breathing.  She also has hives which are itchy.  She has been treated in our ER in the past for anaphylaxis from a bee sting requiring IM epinephrine.  Per our records and per the patient & husband's recollection, she has never required intubation.  HPI     Past Medical History:  Diagnosis Date  . Dyspnea   . Medical history non-contributory     Patient Active Problem List   Diagnosis Date Noted  . Normal labor and delivery 03/28/2018  . Supervision of other normal pregnancy, antepartum 09/17/2017  . Status post vacuum-assisted vaginal delivery 06/19/2015  . Language barrier, cultural differences 05/17/2015    Past Surgical History:  Procedure Laterality Date  . NO PAST SURGERIES       OB History    Gravida  2   Para  2   Term  2   Preterm  0   AB  0   Living  2     SAB  0   TAB  0   Ectopic  0   Multiple  0   Live Births  2           Family History  Problem Relation Age of Onset  . Diabetes Mother     Social History   Tobacco Use  . Smoking status: Never Smoker  . Smokeless tobacco: Current User  . Tobacco comment: Hookah- stopped when pregnant  Vaping Use  . Vaping Use: Never used  Substance Use Topics  . Alcohol use: No  . Drug use: No     Home Medications Prior to Admission medications   Medication Sig Start Date End Date Taking? Authorizing Provider  diphenhydrAMINE (BENADRYL) 25 MG tablet Take 1 tablet (25 mg total) by mouth every 6 (six) hours as needed for up to 30 doses (For allergic reaction). 07/06/19   Terald Sleeper, MD  EPINEPHrine 0.3 mg/0.3 mL IJ SOAJ injection Inject 0.3 mLs (0.3 mg total) into the muscle as needed for anaphylaxis. 07/06/19   Terald Sleeper, MD  predniSONE (DELTASONE) 20 MG tablet 2 tabs po daily x 4 days 11/19/18   Melene Plan, DO    Allergies    Bee venom  Review of Systems   Review of Systems  Constitutional: Negative for chills and fever.  HENT: Positive for facial swelling, sore throat, trouble swallowing and voice change.   Eyes: Negative for pain and visual disturbance.  Respiratory: Positive for chest tightness and shortness of breath.   Cardiovascular: Negative for chest pain and palpitations.  Gastrointestinal: Positive for nausea. Negative for abdominal pain and vomiting.  Musculoskeletal: Negative for arthralgias and back pain.  Skin: Positive for rash. Negative for pallor.  Neurological: Positive for light-headedness. Negative for syncope.  Psychiatric/Behavioral:  Negative for agitation and confusion.  All other systems reviewed and are negative.   Physical Exam Updated Vital Signs BP 96/64 (BP Location: Right Arm)   Pulse 76   Temp 97.9 F (36.6 C) (Oral)   Resp (!) 22   Ht 5\' 1"  (1.549 m)   Wt 61 kg   SpO2 99%   BMI 25.41 kg/m   Physical Exam Vitals and nursing note reviewed.  Constitutional:      General: She is not in acute distress.    Appearance: She is well-developed.  HENT:     Head: Normocephalic and atraumatic.     Comments: Lower lip swelling Oropharynx non-erythematous.  No tonsillar swelling or exudate.  No uvular deviation.  No drooling. No brawny edema. No stridor. Voice is not muffled.  Eyes:     Conjunctiva/sclera: Conjunctivae  normal.  Cardiovascular:     Rate and Rhythm: Regular rhythm. Tachycardia present.     Pulses: Normal pulses.  Pulmonary:     Effort: Pulmonary effort is normal. No respiratory distress.     Breath sounds: Normal breath sounds. No stridor. No wheezing.  Abdominal:     Palpations: Abdomen is soft.     Tenderness: There is no abdominal tenderness.  Musculoskeletal:     Cervical back: Neck supple.  Skin:    General: Skin is warm and dry.     Comments: Diffuse hives  Neurological:     General: No focal deficit present.     Mental Status: She is alert and oriented to person, place, and time.     ED Results / Procedures / Treatments   Labs (all labs ordered are listed, but only abnormal results are displayed) Labs Reviewed  CBC - Abnormal; Notable for the following components:      Result Value   RBC 5.65 (*)    Hemoglobin 15.9 (*)    HCT 47.5 (*)    All other components within normal limits  BASIC METABOLIC PANEL - Abnormal; Notable for the following components:   Glucose, Bld 103 (*)    All other components within normal limits  PREGNANCY, URINE    EKG None  Radiology No results found.  Procedures .Critical Care Performed by: , MD Authorized by: Terald Sleeper, MD   Critical care provider statement:    Critical care time (minutes):  45   Critical care was necessary to treat or prevent imminent or life-threatening deterioration of the following conditions: anaphylaxis.   Critical care was time spent personally by me on the following activities:  Discussions with consultants, evaluation of patient's response to treatment, examination of patient, ordering and performing treatments and interventions, ordering and review of laboratory studies, ordering and review of radiographic studies, pulse oximetry, re-evaluation of patient's condition, obtaining history from patient or surrogate and review of old charts Comments:     Multiple bedside airway  reassessments for anaphylaxis after bee sting requiring IM epinephrine   (including critical care time)  Medications Ordered in ED Medications  sodium chloride 0.9 % bolus 1,000 mL ( Intravenous Stopped 07/06/19 1300)  diphenhydrAMINE (BENADRYL) injection 25 mg (25 mg Intravenous Given 07/06/19 1200)  EPINEPHrine (EPI-PEN) injection 0.3 mg (0.3 mg Intramuscular Given 07/06/19 1154)  methylPREDNISolone sodium succinate (SOLU-MEDROL) 125 mg/2 mL injection 125 mg (125 mg Intravenous Given 07/06/19 1201)  famotidine (PEPCID) IVPB 20 mg premix ( Intravenous Stopped 07/06/19 1235)    ED Course  I have reviewed the triage vital signs and the nursing notes.  Pertinent labs & imaging results that were available during my care of the patient were reviewed by me and considered in my medical decision making (see chart for details).  34 yo female presenting to ER with bee stung on the tongue.  She presents with hives, nausea, and lower lip swelling.  No evidence otherwise of acute airway swelling or compromise, including the tongue.  I cannot visualize the insect sting or any retained stinger on the tongue.    Her BP looks fine here, not in shock.  She was given IM epi given the location of her sting and concern for possible airway edema developing.  Also received IV steroids and benadryl here.    Over a 4 hour period of observation she very rapidly improved, as noted in ED course below, and her family wanted to take her home.  She received instructions in use if IM epi pens and a prescription, and I felt she was stable for discharge.  Clinical Course as of Jul 06 1642  Mon Jul 06, 2019  1212 On reassesment 15 minutes after IM epi, she is feeling better, hives are improving, she continues to have lower lip edema but NO swelling of the tongue or uvula, her voice remains clear, her breathing is subjectively "better."   [MT]  1517 On reassessment she continues to feel better, lip swelling has improved, her  hives are gone.  There is still no tongue swelling or throat swelling.  I still cannot see tongue swelling or a retained stinger where she feels she was stung on the tongue.  We'll provide epi script for home, teach how to use, and I advised if she starts swelling again she needs to come to the ER.  Plan for another 30 minute observation then discharge   [MT]    Clinical Course User Index [MT] Rayvn Rickerson, Carola Rhine, MD    Final Clinical Impression(s) / ED Diagnoses Final diagnoses:  Allergic reaction, initial encounter    Rx / DC Orders ED Discharge Orders         Ordered    EPINEPHrine 0.3 mg/0.3 mL IJ SOAJ injection  As needed     Discontinue  Reprint     07/06/19 1525    diphenhydrAMINE (BENADRYL) 25 MG tablet  Every 6 hours PRN     Discontinue  Reprint     07/06/19 1525           Wyvonnia Dusky, MD 07/06/19 1646

## 2019-07-06 NOTE — Discharge Instructions (Signed)
If your tongue swells again, or you have problems breathing, inject the Epi-pen into your leg like we showed you.  You should take 2 tablets of benadryl (50 mg) at home and come back to the ER.

## 2019-07-06 NOTE — ED Notes (Signed)
Oral and periorbital edema much improved. Hives still present, but lessened.

## 2019-07-06 NOTE — ED Triage Notes (Signed)
Per husband pt had bee sting to tongue-taken to tx ara via w/c-swelling noted to lips, scattered hives-EDP at [facility]

## 2020-08-27 ENCOUNTER — Emergency Department (HOSPITAL_BASED_OUTPATIENT_CLINIC_OR_DEPARTMENT_OTHER)
Admission: EM | Admit: 2020-08-27 | Discharge: 2020-08-27 | Disposition: A | Discharge status: Home / Self Care | Payer: Medicaid Other | Attending: Emergency Medicine | Admitting: Emergency Medicine

## 2020-08-27 ENCOUNTER — Other Ambulatory Visit: Payer: Self-pay

## 2020-08-27 ENCOUNTER — Encounter (HOSPITAL_BASED_OUTPATIENT_CLINIC_OR_DEPARTMENT_OTHER): Payer: Self-pay

## 2020-08-27 ENCOUNTER — Emergency Department (HOSPITAL_BASED_OUTPATIENT_CLINIC_OR_DEPARTMENT_OTHER): Payer: Medicaid Other

## 2020-08-27 DIAGNOSIS — M79604 Pain in right leg: Secondary | ICD-10-CM

## 2020-08-27 DIAGNOSIS — M5441 Lumbago with sciatica, right side: Secondary | ICD-10-CM | POA: Insufficient documentation

## 2020-08-27 DIAGNOSIS — M5431 Sciatica, right side: Secondary | ICD-10-CM

## 2020-08-27 LAB — PREGNANCY, URINE: Preg Test, Ur: NEGATIVE

## 2020-08-27 MED ORDER — PREDNISONE 20 MG PO TABS
40.0000 mg | ORAL_TABLET | Freq: Every day | ORAL | 0 refills | Status: AC
Start: 2020-08-27 — End: 2020-09-01

## 2020-08-27 MED ORDER — METHOCARBAMOL 500 MG PO TABS
500.0000 mg | ORAL_TABLET | Freq: Once | ORAL | Status: AC
  Administered 2020-08-27: 500 mg via ORAL
  Filled 2020-08-27: qty 1

## 2020-08-27 MED ORDER — KETOROLAC TROMETHAMINE 15 MG/ML IJ SOLN
15.0000 mg | Freq: Once | INTRAMUSCULAR | Status: AC
  Administered 2020-08-27: 15 mg via INTRAMUSCULAR
  Filled 2020-08-27: qty 1

## 2020-08-27 NOTE — Discharge Instructions (Addendum)
Take prednisone as prescribed. Use Tylenol as needed for breakthrough pain. Use heat or ice to help with pain control as needed. If your pain persists, follow-up with the sports medicine doctor listed below for further evaluation management of your back. If you develop fevers, severe worsening pain, numbness, loss of bowel bladder control, return to the emergency room for further evaluation.   ?? ????????? ??? ????? ??????? ????. ?????? ???????? ??? ?????? ???? ????????. ?????? ??????? ?? ????? ???????? ?? ??????? ??? ????? ??? ??????. ??? ????? ????? ? ????? ?? ???? ???? ??????? ??????? ????? ????? ?? ??????? ?????? ????. ??? ??? ????? ?? ????? ? ?????? ?????? ???????? ? ???????? ? ?????? ??????? ??? ??????? ?? ??????? ? ????? ??? ???? ??????? ????? ?? ???????.

## 2020-08-27 NOTE — ED Provider Notes (Signed)
[facility] EMERGENCY DEPARTMENT Provider Note   CSN: [id number] Arrival date & time: 08/27/20  9741     History Chief Complaint  Patient presents with   Leg Pain    Misty Allen is a 35 y.o. female presenting for evaluation of right leg pain.  History obtained with assistance from [employer] language services as patient speaks Arabic.  Patient states she was awoken from sleep this morning with severe right-sided leg pain.  Begins in her buttocks and extends towards her foot.  Pain is constant, improved slightly when she sat in a warm bath, but pain returned as soon as she got out of the bath.  She is not taking any medicine for this.  Walking does not change her pain.  No fall, trauma, or injury.  No history of similar.  No fevers, chills, nausea, vomiting, urinary symptoms, loss of bowel bladder control, numbness, history of cancer, history of IVDU.  No previous history of back problems.  She has no medical problems, takes no medications daily  HPI     Past Medical History:  Diagnosis Date   Dyspnea    Medical history non-contributory     Patient Active Problem List   Diagnosis Date Noted   Normal labor and delivery 03/28/2018   Supervision of other normal pregnancy, antepartum 09/17/2017   Status post vacuum-assisted vaginal delivery 06/19/2015   Language barrier, cultural differences 05/17/2015    Past Surgical History:  Procedure Laterality Date   NO PAST SURGERIES       OB History     Gravida  2   Para  2   Term  2   Preterm  0   AB  0   Living  2      SAB  0   IAB  0   Ectopic  0   Multiple  0   Live Births  2           Family History  Problem Relation Age of Onset   Diabetes Mother     Social History   Tobacco Use   Smoking status: Never   Smokeless tobacco: Current   Tobacco comments:    Hookah- stopped when pregnant  Vaping Use   Vaping Use: Never used  Substance Use Topics   Alcohol use: No   Drug use: No     Home Medications Prior to Admission medications   Medication Sig Start Date End Date Taking? Authorizing Provider  predniSONE (DELTASONE) 20 MG tablet Take 2 tablets (40 mg total) by mouth daily for 5 days. 08/27/20 09/01/20 Yes Rebecka Oelkers, PA-C  diphenhydrAMINE (BENADRYL) 25 MG tablet Take 1 tablet (25 mg total) by mouth every 6 (six) hours as needed for up to 30 doses (For allergic reaction). 07/06/19   Terald Sleeper, MD  EPINEPHrine 0.3 mg/0.3 mL IJ SOAJ injection Inject 0.3 mLs (0.3 mg total) into the muscle as needed for anaphylaxis. 07/06/19   Terald Sleeper, MD    Allergies    Bee venom  Review of Systems   Review of Systems  Musculoskeletal:  Positive for myalgias.  Neurological:  Negative for numbness.   Physical Exam Updated Vital Signs BP 102/68   Pulse (!) 59   Temp 97.7 F (36.5 C) (Oral)   Resp 19   Ht 5\' 2"  (1.575 m)   Wt 60 kg   LMP 08/24/2020 (Exact Date)   SpO2 100%   BMI 24.19 kg/m   Physical Exam Vitals and  nursing note reviewed.  Constitutional:      General: She is not in acute distress.    Appearance: She is well-developed.     Comments: nontoxic  HENT:     Head: Normocephalic and atraumatic.  Eyes:     Extraocular Movements: Extraocular movements intact.  Cardiovascular:     Rate and Rhythm: Normal rate.  Pulmonary:     Effort: Pulmonary effort is normal.  Abdominal:     General: There is no distension.     Tenderness: There is no abdominal tenderness. There is no guarding.  Musculoskeletal:        General: Tenderness present.     Cervical back: Normal range of motion.     Comments: Tenderness palpation of the right leg including over the calf.  Pain begins with palpation over the buttocks.  No tenderness palpation of the hip, low back, or midline back.  No tenderness palpation of the left leg.  Pedal pulses 2+.  No saddle anesthesia.  Patient unable to perform straight leg raise due to pain.  Skin:    General: Skin is warm.      Findings: No rash.  Neurological:     Mental Status: She is alert and oriented to person, place, and time.    ED Results / Procedures / Treatments   Labs (all labs ordered are listed, but only abnormal results are displayed) Labs Reviewed  PREGNANCY, URINE    EKG None  Radiology US Venous Img Lower Right (DVT Study)  Result Date: 08/27/2020 CLINICAL DATA:  Right hip pain EXAM: RIGHT LOWER EXTREMITY VENOUS DOPPLER ULTRASOUND TECHNIQUE: Gray-scale sonography with compression, as well as color and duplex ultrasound, were performed to evaluate the deep venous system(s) from the level of the common femoral vein through the popliteal and proximal calf veins. COMPARISON:  None. FINDINGS: VENOUS Normal compressibility of the common femoral, superficial femoral, and popliteal veins, as well as the visualized calf veins. Visualized portions of profunda femoral vein and great saphenous vein unremarkable. No filling defects to suggest DVT on grayscale or color Doppler imaging. Doppler waveforms show normal direction of venous flow, normal respiratory phasicity and response to augmentation. Limited views of the contralateral common femoral vein are unremarkable. OTHER None. Limitations: none IMPRESSION: No femoropopliteal DVT nor evidence of DVT within the visualized calf veins. If clinical symptoms are inconsistent or if there are persistent or worsening symptoms, further imaging (possibly involving the iliac veins) may be warranted. Electronically Signed   By: Corlis Leak M.D.   On: 08/27/2020 11:33    Procedures Procedures   Medications Ordered in ED Medications  ketorolac (TORADOL) 15 MG/ML injection 15 mg (15 mg Intramuscular Given 08/27/20 1040)  methocarbamol (ROBAXIN) tablet 500 mg (500 mg Oral Given 08/27/20 1042)    ED Course  I have reviewed the triage vital signs and the nursing notes.  Pertinent labs & imaging results that were available during my care of the patient were reviewed  by me and considered in my medical decision making (see chart for details).    MDM Rules/Calculators/A&P                           Patient presented for evaluation of right leg pain.  On exam, patient peers nontoxic.  She is neurovascular intact.  She does have pain of her right leg and difficulty performing straight leg raise.  Based on the distribution, this is likely sciatica and originating from the  back.  However without history of back problems, fall, trauma, or injury, consider other causes including DVT, although pt without risk factors.doubt bony cause as pain extends over multiple bones/joints. Will tx symptomatically for sciatica and obtain DVT US.  Ultrasound negative for DVT.  On reevaluation, patient reports pain is completely resolved with Toradol shot.  I discussed likely sciatica and continued symptomatic management with anti-inflammatories.  If not improving, will have pt f/u with sports med. At this time, patient appears safe for discharge. Return precautions given.  Patient states she understands and agrees to plan.  Final Clinical Impression(s) / ED Diagnoses Final diagnoses:  Right leg pain  Sciatica of right side    Rx / DC Orders ED Discharge Orders          Ordered    predniSONE (DELTASONE) 20 MG tablet  Daily        08/27/20 1158             [place], PA-C 08/27/20 1216    Long, Arlyss Repress, MD 08/28/20 [phone]

## 2020-08-27 NOTE — ED Triage Notes (Signed)
"  Woke up this morning with pain in right hip" per pt

## 2020-11-11 ENCOUNTER — Ambulatory Visit: Payer: Medicaid Other | Admitting: Family

## 2020-11-24 ENCOUNTER — Other Ambulatory Visit: Payer: Self-pay

## 2020-11-24 ENCOUNTER — Other Ambulatory Visit (HOSPITAL_COMMUNITY)
Admission: RE | Admit: 2020-11-24 | Discharge: 2020-11-24 | Disposition: A | Discharge status: Home / Self Care | Payer: Medicaid Other | Source: Ambulatory Visit | Attending: Obstetrics & Gynecology | Admitting: Obstetrics & Gynecology

## 2020-11-24 ENCOUNTER — Encounter: Payer: Self-pay | Admitting: Obstetrics & Gynecology

## 2020-11-24 ENCOUNTER — Ambulatory Visit (INDEPENDENT_AMBULATORY_CARE_PROVIDER_SITE_OTHER): Payer: Medicaid Other | Admitting: Obstetrics & Gynecology

## 2020-11-24 ENCOUNTER — Telehealth: Payer: Self-pay

## 2020-11-24 VITALS — BP 117/73 | HR 76 | Ht 64.0 in | Wt 135.1 lb

## 2020-11-24 DIAGNOSIS — Z23 Encounter for immunization: Secondary | ICD-10-CM

## 2020-11-24 DIAGNOSIS — N926 Irregular menstruation, unspecified: Secondary | ICD-10-CM

## 2020-11-24 DIAGNOSIS — B3731 Acute candidiasis of vulva and vagina: Secondary | ICD-10-CM

## 2020-11-24 DIAGNOSIS — N6452 Nipple discharge: Secondary | ICD-10-CM

## 2020-11-24 DIAGNOSIS — B379 Candidiasis, unspecified: Secondary | ICD-10-CM

## 2020-11-24 NOTE — Telephone Encounter (Signed)
Called Pt using [employer] id# [phone]  to advise of U/S Pelvic appt. At [facility]. On 12/02/20 at 2:30p & Breast U/S on 12/29/20 @ 8:40, no answer, left VM.

## 2020-11-24 NOTE — Patient Instructions (Signed)
Abnormal Uterine Bleeding Abnormal uterine bleeding is unusual bleeding from the uterus. It includes bleeding after sex, or bleeding or spotting between menstrual periods. It may also include bleeding that is heavier than normal, menstrual periods that last longer than usual, or bleeding that occurs after menopause. Abnormal uterine bleeding can affect teenagers, women in their reproductive years, pregnant women, and women who have reached menopause. Common causes of abnormal uterine bleeding include: Pregnancy. Abnormal growths within the lining of the uterus (polyps). Benign tumors or growths in the uterus (fibroids). These are not cancer. Infection. Cancer. Too much or too little of some hormones in the body (hormonal imbalances). Any type of abnormal bleeding should be checked by a health care provider. Many cases are minor and simple to treat, but others may be more serious. Treatment will depend on the cause of the bleeding and how severe it is. Follow these instructions at home: Medicines Take over-the-counter and prescription medicines only as told by your health care provider. Ask your health care provider about: Taking medicines such as aspirin and ibuprofen. These medicines can thin your blood. Do not take these medicines unless your health care provider tells you to take them. Taking over-the-counter medicines, vitamins, herbs, and supplements. If you were prescribed iron pills, take them as told by your health care provider. Iron pills help to replace iron that your body loses because of this condition. Managing constipation In cases of severe bleeding, you may be asked to increase your iron intake to treat anemia. Doing this may cause constipation. To prevent or treat constipation, you may need to: Drink enough fluid to keep your urine pale yellow. Take over-the-counter or prescription medicines. Eat foods that are high in fiber, such as beans, whole grains, and fresh fruits and  vegetables. Limit foods that are high in fat and processed sugars, such as fried or sweet foods. Activity Alter your activity to decrease bleeding if you need to change your sanitary pad more than one time every 2 hours: Lie in bed with your feet raised (elevated). Place a cold pack on your lower abdomen. Rest as much as possible until the bleeding stops or slows down. General instructions Do not use tampons, douche, or have sex until your health care provider says these things are okay. Change your sanitary pads often. Get regular exams. These include pelvic exams and cervical cancer screenings. It is up to you to get the results of any tests that are done. Ask your health care provider, or the department that is doing the tests, when your results will be ready. Monitor your condition for any changes. For 2 months, write down: When your menstrual period starts. When your menstrual period ends. When any abnormal vaginal bleeding occurs. What problems you notice. Keep all follow-up visits. This is important. Contact a health care provider if: You have bleeding that lasts for more than one week. You feel dizzy at times. You feel nauseous or you vomit. You feel light-headed or weak. You notice any other changes that show that your condition is getting worse. Get help right away if: You faint. You have bleeding that soaks through a sanitary pad every hour. You have pain in the abdomen. You have a fever or chills. You become sweaty or weak. You pass large blood clots from your vagina. These symptoms may represent a serious problem that is an emergency. Do not wait to see if the symptoms will go away. Get medical help right away. Call your local emergency services (911   in the [place] Do not drive yourself to the hospital. Summary Abnormal uterine bleeding is unusual bleeding from the uterus. Any type of abnormal bleeding should be checked by a health care provider. Many cases are minor and  simple to treat, but others may be more serious. Treatment will depend on the cause of the bleeding and how severe it is. Get help right away if you faint, you have bleeding that soaks through a sanitary pad every hour, or you pass large blood clots from your vagina. This information is not intended to replace advice given to you by your health care provider. Make sure you discuss any questions you have with your health care provider. Document Revised: 05/03/2020 Document Reviewed: 05/03/2020 Elsevier Patient Education  2022 [employer]  

## 2020-11-24 NOTE — Progress Notes (Signed)
GYNECOLOGY OFFICE VISIT NOTE  History:   Misty Allen is a 35 y.o. [id number] here today for evaluation of irregular menstrual periods and abnormal breast discharge.  Patient is Arabic-speaking only, interpreter present for this encounter. She reports having two periods every month since the delivery of her last child in 2020. Also she stopped breastfeeding over a year ago, but still has occasional milky or brown discharge from both breasts. She was [place] and was given medication to stop the discharge, she has stopped taking the medication as it did not help.  She does not recall them saying anything about elevated prolactin or other hormone level, or why she had to take the medication. She wants to conceive again and feels these issues are interfering with this desire. She denies any current abnormal vaginal discharge, bleeding, pelvic pain or other gynecologic concerns.    Past Medical History:  Diagnosis Date   Medical history non-contributory     Past Surgical History:  Procedure Laterality Date   NO PAST SURGERIES      The following portions of the patient's history were reviewed and updated as appropriate: allergies, current medications, past family history, past medical history, past social history, past surgical history and problem list.   Health Maintenance:  Normal pap in 09/17/2017 with negative HRHPV.  Review of Systems:  Pertinent items noted in HPI and remainder of comprehensive ROS otherwise negative.  Physical Exam:  BP 117/73   Pulse 76   Ht 5\' 4"  (1.626 m)   Wt 135 lb 1.6 oz (61.3 kg)   LMP 11/11/2020   BMI 23.19 kg/m  CONSTITUTIONAL: Well-developed, well-nourished female in no acute distress.  BREASTS: Symmetric in size. Drops of milky discharge expressed from right breast breast, and milky-brown discharge expressed from left breast.  No masses, tenderness, skin changes or lymphadenopathy bilaterally. MUSCULOSKELETAL: Normal range of  motion. No edema noted. NEUROLOGIC: Alert and oriented to person, place, and time. Normal muscle tone coordination. No cranial nerve deficit noted. PSYCHIATRIC: Normal mood and affect. Normal behavior. Normal judgment and thought content. CARDIOVASCULAR: Normal heart rate noted RESPIRATORY: Effort and breath sounds normal, no problems with respiration noted ABDOMEN: No masses noted. No other overt distention noted.   PELVIC: Normal appearing external genitalia and urethral meatus; normal appearing vaginal mucosa and cervix.  No abnormal discharge noted.  Pap smear obtained.  Normal uterine size, no other palpable masses, no uterine or adnexal tenderness.     Assessment and Plan:     1. Irregular menstrual bleeding Patient has abnormal uterine bleeding. Apart from her milky breast discharge, she has a normal pelvic exam, no evidence of lesions.  Will order abnormal uterine bleeding evaluation labs and pelvic ultrasound to evaluate for any structural gynecologic abnormalities.  Offered hormonal management of her AUB, she declined due to possible contraceptive effect of hormones.  Will contact patient with these results and plans for further evaluation/management. - Cytology - PAP( [place]) - Hemoglobin A1c - Beta hCG quant (ref lab) - Follicle stimulating hormone - TSH+Prl+TestT+TestF+17OHP - CBC - 11/13/2020 PELVIC COMPLETE WITH TRANSVAGINAL; Future - Cervicovaginal ancillary only  2. Breast discharge Concerned about elevated prolactin levels given her history, will follow up above labs. Breast imaging also ordered, will follow up results and manage accordingly. - MM DIAG BREAST TOMO BILATERAL; Future - US BREAST LTD UNI LEFT INC AXILLA; Future - US BREAST LTD UNI RIGHT INC AXILLA; Future  3. Flu vaccine need - Flu Vaccine QUAD  51mo+IM (Fluarix, Fluzone & Alfiuria Quad PF) given as per her request  Routine preventative health maintenance measures emphasized. Please refer to After Visit  Summary for other counseling recommendations.   Return in about 3 weeks (around 12/15/2020) for Discuss results after pelvic and breast ultrasound, labs for AUB, breast discharge - ARABIC.    I spent 30 minutes dedicated to the care of this patient including pre-visit review of records, face to face time with the patient discussing her conditions and treatments and post visit orders.    Jaynie Collins, MD, FACOG Obstetrician & Gynecologist, [facility] for [employer], [facility]

## 2020-11-25 LAB — CERVICOVAGINAL ANCILLARY ONLY
Bacterial Vaginitis (gardnerella): NEGATIVE
Candida Glabrata: POSITIVE — AB
Candida Vaginitis: NEGATIVE
Chlamydia: NEGATIVE
Comment: NEGATIVE
Comment: NEGATIVE
Comment: NEGATIVE
Comment: NEGATIVE
Comment: NEGATIVE
Comment: NORMAL
Neisseria Gonorrhea: NEGATIVE
Trichomonas: NEGATIVE

## 2020-11-25 MED ORDER — FLUCONAZOLE 150 MG PO TABS: 150.0000 mg | ORAL_TABLET | Freq: Once | ORAL | 3 refills | Status: AC

## 2020-11-25 MED ORDER — BORIC ACID CRYS: 600.0000 mg | CRYSTALS | Freq: Every day | 2 refills | Status: AC

## 2020-11-25 NOTE — Addendum Note (Signed)
Addended by: Jaynie Collins A on: 11/25/2020 05:50 PM   Modules accepted: Orders

## 2020-11-28 ENCOUNTER — Telehealth: Payer: Self-pay

## 2020-11-28 LAB — CYTOLOGY - PAP
Comment: NEGATIVE
Diagnosis: NEGATIVE
High risk HPV: NEGATIVE

## 2020-11-28 NOTE — Telephone Encounter (Addendum)
-----   Message from Tereso Newcomer, MD sent at 11/25/2020  5:50 PM EST ----- Vaginal discharge test is abnormal and showed yeast vaginitis but with C. glabrata. Diflucan and Boric acid vaginal capsules prescribed, sent to [facility].  Please inform patient of results, advise to pick up prescribed medications and take as directed. She is Arabic speaking.   Called pt with [facility] interpreter ID [phone]. VM left stating I am calling with results and new medication instructions. Requested pt call back.

## 2020-11-29 LAB — BETA HCG QUANT (REF LAB): hCG Quant: <1 m[IU]/mL

## 2020-11-29 LAB — CBC
Hematocrit: 39.6 % (ref 34.0–46.6)
Hemoglobin: 13.4 g/dL (ref 11.1–15.9)
MCH: 29 pg (ref 26.6–33.0)
MCHC: 33.8 g/dL (ref 31.5–35.7)
MCV: 86 fL (ref 79–97)
Platelets: 245 10*3/uL (ref 150–450)
RBC: 4.62 x10E6/uL (ref 3.77–5.28)
RDW: 12.7 % (ref 11.7–15.4)
WBC: 4.2 10*3/uL (ref 3.4–10.8)

## 2020-11-29 LAB — TSH+PRL+TESTT+TESTF+17OHP
17-Hydroxyprogesterone: 25 ng/dL
Prolactin: 25 ng/mL — ABNORMAL HIGH (ref 4.8–23.3)
TSH: 1.19 u[IU]/mL (ref 0.450–4.500)
Testosterone, Free: 1.4 pg/mL (ref 0.0–4.2)
Testosterone, Total, LC/MS: 19.1 ng/dL (ref 10.0–55.0)

## 2020-11-29 LAB — HEMOGLOBIN A1C
Est. average glucose Bld gHb Est-mCnc: 97 mg/dL
Hgb A1c MFr Bld: 5 % (ref 4.8–5.6)

## 2020-11-29 LAB — FOLLICLE STIMULATING HORMONE: FSH: 5 m[IU]/mL

## 2020-11-30 NOTE — Telephone Encounter (Signed)
New result sent from [facility], MD, please also notify patient of this when calling.  "Mildly elevated prolactin level, needs to be rechecked as a fasting sample.  Please let patient know that this is necessary to evaluate if this is a true elevation of her prolactin and will help determine further management. Other labs are normal and reassuring.  Please call to inform patient of results and recommendations. She is Arabic speaking."

## 2020-12-02 ENCOUNTER — Other Ambulatory Visit: Payer: Self-pay

## 2020-12-02 ENCOUNTER — Ambulatory Visit (HOSPITAL_COMMUNITY)
Admission: RE | Admit: 2020-12-02 | Discharge: 2020-12-02 | Disposition: A | Discharge status: Home / Self Care | Payer: Medicaid Other | Source: Ambulatory Visit | Attending: Obstetrics & Gynecology | Admitting: Obstetrics & Gynecology

## 2020-12-02 DIAGNOSIS — N926 Irregular menstruation, unspecified: Secondary | ICD-10-CM | POA: Diagnosis not present

## 2020-12-05 NOTE — Telephone Encounter (Signed)
Called patient with pacific interpreter [phone], no answer- left message to call us back with results.

## 2020-12-06 NOTE — Telephone Encounter (Signed)
Unable to reach pt. Pt has follow up appt in our office on 12/1 and will discuss results and recommendations with provider.  Misty Allen

## 2020-12-08 ENCOUNTER — Other Ambulatory Visit: Payer: Self-pay

## 2020-12-08 ENCOUNTER — Emergency Department (HOSPITAL_BASED_OUTPATIENT_CLINIC_OR_DEPARTMENT_OTHER)
Admission: EM | Admit: 2020-12-08 | Discharge: 2020-12-08 | Disposition: A | Discharge status: Home / Self Care | Payer: Medicaid Other | Attending: Emergency Medicine | Admitting: Emergency Medicine

## 2020-12-08 ENCOUNTER — Encounter (HOSPITAL_BASED_OUTPATIENT_CLINIC_OR_DEPARTMENT_OTHER): Payer: Self-pay | Admitting: *Deleted

## 2020-12-08 DIAGNOSIS — U071 COVID-19: Secondary | ICD-10-CM | POA: Diagnosis not present

## 2020-12-08 DIAGNOSIS — H53149 Visual discomfort, unspecified: Secondary | ICD-10-CM | POA: Diagnosis not present

## 2020-12-08 DIAGNOSIS — F17220 Nicotine dependence, chewing tobacco, uncomplicated: Secondary | ICD-10-CM | POA: Insufficient documentation

## 2020-12-08 DIAGNOSIS — R519 Headache, unspecified: Secondary | ICD-10-CM | POA: Diagnosis present

## 2020-12-08 LAB — RESP PANEL BY RT-PCR (FLU A&B, COVID) ARPGX2
Influenza A by PCR: NEGATIVE
Influenza B by PCR: NEGATIVE
SARS Coronavirus 2 by RT PCR: POSITIVE — AB

## 2020-12-08 MED ORDER — ACETAMINOPHEN 325 MG PO TABS
650.0000 mg | ORAL_TABLET | Freq: Once | ORAL | Status: AC
  Administered 2020-12-08: 650 mg via ORAL
  Filled 2020-12-08: qty 2

## 2020-12-08 NOTE — ED Provider Notes (Signed)
[facility] EMERGENCY DEPARTMENT Provider Note   CSN: [id number] Arrival date & time: 12/08/20  1408     History Chief Complaint  Patient presents with   Covid Exposure    + Covid    Misty Allen is a 35 y.o. female.  35 y.o female with no PMH presents to the ED with a chief complaint of headache, cough, body aches x this morning. Patient describes her headache as throbbing sensation to bilateral temples without any radiation.  Rating this headache about a 4 out of 10.  Worsening with Polite, endorsing some photophobia.  Also has some nasal congestion and a dry cough.  Does report her husband is sick with similar symptoms.  She has not taken any medication for improvement in symptoms.  No immunizations on file.  No fever, no chest pain, no shortness of breath.    The history is provided by the patient.  Headache Pain location:  R temporal and L temporal Quality:  Sharp Radiates to:  Does not radiate Severity currently:  4/10 Duration:  5 hours Timing:  Intermittent Progression:  Worsening Chronicity:  New Similar to prior headaches: no   Context: bright light   Ineffective treatments:  None tried Associated symptoms: photophobia   Associated symptoms: no congestion, no cough, no fever, no myalgias and no neck pain       Past Medical History:  Diagnosis Date   Medical history non-contributory     Patient Active Problem List   Diagnosis Date Noted   Irregular menstrual bleeding 11/24/2020   Language barrier, cultural differences 05/17/2015    Past Surgical History:  Procedure Laterality Date   NO PAST SURGERIES       OB History     Gravida  2   Para  2   Term  2   Preterm  0   AB  0   Living  2      SAB  0   IAB  0   Ectopic  0   Multiple  0   Live Births  2           Family History  Problem Relation Age of Onset   Diabetes Mother     Social History   Tobacco Use   Smoking status: Never   Smokeless tobacco:  Current   Tobacco comments:    Hookah- stopped when pregnant  Vaping Use   Vaping Use: Never used  Substance Use Topics   Alcohol use: No   Drug use: No    Home Medications Prior to Admission medications   Medication Sig Start Date End Date Taking? Authorizing Provider  Boric Acid [place] 600 mg vaginally at bedtime for 14 days. 11/25/20 12/09/20  Anyanwu, Jethro Bastos, MD  diphenhydrAMINE (BENADRYL) 25 MG tablet Take 1 tablet (25 mg total) by mouth every 6 (six) hours as needed for up to 30 doses (For allergic reaction). 07/06/19   Terald Sleeper, MD  EPINEPHrine 0.3 mg/0.3 mL IJ SOAJ injection Inject 0.3 mLs (0.3 mg total) into the muscle as needed for anaphylaxis. 07/06/19   Terald Sleeper, MD    Allergies    Bee venom  Review of Systems   Review of Systems  Constitutional:  Negative for fever.  HENT:  Negative for congestion.   Eyes:  Positive for photophobia.  Respiratory:  Negative for cough.   Musculoskeletal:  Negative for myalgias and neck pain.  Neurological:  Positive for headaches.   Physical Exam  Updated Vital Signs BP 103/78 (BP Location: Right Arm)   Pulse 82   Temp 98.3 F (36.8 C) (Oral)   Resp 18   Ht 5\' 4"  (1.626 m)   Wt 61.3 kg   LMP 11/11/2020   SpO2 98%   BMI 23.20 kg/m   Physical Exam Vitals and nursing note reviewed.  Constitutional:      Appearance: Normal appearance.  HENT:     Head: Normocephalic and atraumatic.     Nose: Congestion present.     Mouth/Throat:     Mouth: Mucous membranes are moist.     Pharynx: No oropharyngeal exudate or posterior oropharyngeal erythema.  Eyes:     Pupils: Pupils are equal, round, and reactive to light.  Cardiovascular:     Rate and Rhythm: Normal rate.  Pulmonary:     Effort: Pulmonary effort is normal.     Breath sounds: No wheezing.     Comments: No wheezing, rhonchi or rales.  Abdominal:     General: Abdomen is flat.  Musculoskeletal:     Cervical back: Normal range of motion and neck  supple.  Skin:    General: Skin is warm and dry.  Neurological:     Mental Status: She is alert and oriented to person, place, and time.     Comments: Moves all upper and lower extremities Ambulatory with steady gate    ED Results / Procedures / Treatments   Labs (all labs ordered are listed, but only abnormal results are displayed) Labs Reviewed  RESP PANEL BY RT-PCR (FLU A&B, COVID) ARPGX2 - Abnormal; Notable for the following components:      Result Value   SARS Coronavirus 2 by RT PCR POSITIVE (*)    All other components within normal limits    EKG None  Radiology No results found.  Procedures Procedures   Medications Ordered in ED Medications  acetaminophen (TYLENOL) tablet 650 mg (650 mg Oral Given 12/08/20 1517)    ED Course  I have reviewed the triage vital signs and the nursing notes.  Pertinent labs & imaging results that were available during my care of the patient were reviewed by me and considered in my medical decision making (see chart for details).    MDM Rules/Calculators/A&P  Patient presents with headache, fatigue, cough, tiredness that began this morning.  Husband who currently lives with her with similar symptoms  In the ED with stable vital signs, afebrile.  Has not taken anything for her headache.  Denies any vision changes, however does endorse some photophobia no nausea no vomiting no trauma.  During evaluation is overall nontoxic, non-ill-appearing.  Neuro exam is unremarkable moving all upper and lower extremities, ambulatory in the ED.  Given Tylenol for her symptoms.  Patient has COVID-19 infection at this time.  We did discuss symptomatic treatment at home with Tylenol and ibuprofen.  She understands and agrees with management, without any further work-up needed at this time.  Patient stable for discharge.   Portions of this note were generated with 12/10/20. Dictation errors may occur despite best attempts at  proofreading.  Final Clinical Impression(s) / ED Diagnoses Final diagnoses:  COVID-19 virus infection    Rx / DC Orders ED Discharge Orders     None        Scientist, clinical (histocompatibility and immunogenetics), PA-C 12/08/20 1539    12/10/20, DO 12/08/20 1743

## 2020-12-08 NOTE — Discharge Instructions (Addendum)
Your COVID-19 results on today's visit were positive.  He will need to remain at home for the next 5 days, you may also wear a mask 5 days after.  Please continue to hydrate with plenty of fluids.  You may also take Tylenol or ibuprofen to help with your symptoms.

## 2020-12-08 NOTE — ED Triage Notes (Signed)
Headache, fatigue, cough, and body aches since last night. Covid exposure.

## 2020-12-15 ENCOUNTER — Ambulatory Visit: Payer: Medicaid Other | Admitting: Student

## 2020-12-29 ENCOUNTER — Other Ambulatory Visit: Payer: Medicaid Other

## 2021-01-15 NOTE — L&D Delivery Note (Signed)
DELIVERY NOTE  Pt complete and at 0 station with urge to push. Pt pushed and delivered a viable female  infant in ROA position. Loose nuchal delivered through. Anterior and posterior shoulders spontaneously delivered with next two pushes; body easily followed next. Infant placed on mothers abdomen and bulb suction of mouth and nose performed. Cord was then clamped and cut by MD. Cord blood obtained, 3VC. Baby had a vigorous spontaneous cry noted. Placenta then delivered at 1137 intact. Fundal massage performed and pitocin per protocol. Fundus firm. The following lacerations were noted: NONE, Ebl 250cc. Mother and baby stable. Counts correct   Infant time: 1132 Gender: female, confirm circ desire Placenta time: 1137 Apgars: 9/9 Weight: 6lb2oz

## 2021-02-10 ENCOUNTER — Ambulatory Visit
Admission: RE | Admit: 2021-02-10 | Discharge: 2021-02-10 | Disposition: A | Discharge status: Home / Self Care | Payer: Medicaid Other | Source: Ambulatory Visit | Attending: Obstetrics & Gynecology | Admitting: Obstetrics & Gynecology

## 2021-02-10 DIAGNOSIS — N6452 Nipple discharge: Secondary | ICD-10-CM

## 2021-02-23 ENCOUNTER — Ambulatory Visit (INDEPENDENT_AMBULATORY_CARE_PROVIDER_SITE_OTHER): Payer: Medicaid Other | Admitting: Student

## 2021-02-23 ENCOUNTER — Other Ambulatory Visit: Payer: Self-pay

## 2021-02-23 VITALS — BP 104/59 | HR 81 | Wt 139.6 lb

## 2021-02-23 DIAGNOSIS — Z712 Person consulting for explanation of examination or test findings: Secondary | ICD-10-CM | POA: Diagnosis not present

## 2021-02-23 NOTE — Progress Notes (Signed)
°  History:  [name] is a 36 y.o. G2P2002 who presents to clinic today for follow-up on breast US results.  She is currently in her first trimester and is getting her care at [facility].   The following portions of the patient's history were reviewed and updated as appropriate: allergies, current medications, family history, past medical history, social history, past surgical history and problem list.  Review of Systems:  ROS    Objective:  Physical Exam BP (!) 104/59    Pulse 81    Wt 139 lb 9.6 oz (63.3 kg)    LMP 12/09/2020    BMI 23.96 kg/m  Physical Exam Constitutional:      Appearance: Normal appearance.  Musculoskeletal:        General: Normal range of motion.  Neurological:     General: No focal deficit present.     Mental Status: She is alert.      Labs and Imaging No results found for this or any previous visit (from the past 24 hour(s)).  No results found.  Health Maintenance Due  Topic Date Due   Hepatitis C Screening  Never done     Assessment & Plan:   1. Encounter to discuss test results   -husband was present on telephone speaker phone to discuss results -reviewed breast US results and confirmed no malignanecy -labs reviewed; prolactin slightly elevated but otherwise all labs normal -patient has started prenatal care at [facility]   Approximately 15 minutes of total time was spent with this patient on lab results and discussion of future care.   Marylene Land, CNM 02/23/2021 9:17 AM

## 2021-02-27 LAB — OB RESULTS CONSOLE GC/CHLAMYDIA
Chlamydia: NEGATIVE
Neisseria Gonorrhea: NEGATIVE

## 2021-02-27 LAB — HEPATITIS C ANTIBODY: HCV Ab: NEGATIVE

## 2021-02-27 LAB — OB RESULTS CONSOLE ABO/RH: RH Type: POSITIVE

## 2021-02-27 LAB — OB RESULTS CONSOLE ANTIBODY SCREEN: Antibody Screen: NEGATIVE

## 2021-02-27 LAB — OB RESULTS CONSOLE RPR: RPR: NONREACTIVE

## 2021-02-27 LAB — OB RESULTS CONSOLE HIV ANTIBODY (ROUTINE TESTING): HIV: NONREACTIVE

## 2021-02-27 LAB — OB RESULTS CONSOLE RUBELLA ANTIBODY, IGM: Rubella: IMMUNE

## 2021-02-27 LAB — OB RESULTS CONSOLE HEPATITIS B SURFACE ANTIGEN: Hepatitis B Surface Ag: NEGATIVE

## 2021-04-25 ENCOUNTER — Other Ambulatory Visit: Payer: Self-pay | Admitting: Obstetrics and Gynecology

## 2021-04-25 DIAGNOSIS — Z3689 Encounter for other specified antenatal screening: Secondary | ICD-10-CM

## 2021-05-11 ENCOUNTER — Encounter: Payer: Self-pay | Admitting: *Deleted

## 2021-05-16 ENCOUNTER — Ambulatory Visit: Payer: Medicaid Other | Admitting: *Deleted

## 2021-05-16 ENCOUNTER — Other Ambulatory Visit: Payer: Self-pay | Admitting: *Deleted

## 2021-05-16 ENCOUNTER — Ambulatory Visit: Payer: Medicaid Other

## 2021-05-16 ENCOUNTER — Ambulatory Visit: Payer: Medicaid Other | Attending: Obstetrics and Gynecology

## 2021-05-16 ENCOUNTER — Ambulatory Visit (HOSPITAL_BASED_OUTPATIENT_CLINIC_OR_DEPARTMENT_OTHER): Payer: Medicaid Other | Admitting: Maternal & Fetal Medicine

## 2021-05-16 VITALS — BP 102/58 | HR 89

## 2021-05-16 DIAGNOSIS — Z3689 Encounter for other specified antenatal screening: Secondary | ICD-10-CM | POA: Diagnosis not present

## 2021-05-16 DIAGNOSIS — Z8759 Personal history of other complications of pregnancy, childbirth and the puerperium: Secondary | ICD-10-CM | POA: Diagnosis not present

## 2021-05-16 DIAGNOSIS — Z363 Encounter for antenatal screening for malformations: Secondary | ICD-10-CM | POA: Diagnosis present

## 2021-05-16 DIAGNOSIS — Z87448 Personal history of other diseases of urinary system: Secondary | ICD-10-CM

## 2021-05-16 DIAGNOSIS — Z362 Encounter for other antenatal screening follow-up: Secondary | ICD-10-CM

## 2021-05-16 NOTE — Progress Notes (Signed)
MFM Brief Note ? ?Ms. 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 ? ?She is seen at the request of Dr. Huel Cote. ? ?Single intrauterine pregnancy here for a detailed exam. ?Normal anatomy with measurements consistent with dates ?There is good fetal movement and amniotic fluid volume ?Suboptimal views of the fetal anatomy were obtained secondary to fetal position. ? ?Ms. Modesitt's overall medical history is uncomplicated. She had  low risk Cell Free DNA and negative horizon. ? ?She had  normal uncomplicated pregnancy 2020 female - who is doing well. ?Her daughter who was born in 2017 was delivered with an AC 3% and EFW 22%. 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 She was delivery via VAVD uncomplicated. ? ?I discussed with Ms. 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 ? ?Given the FGR that was seen I do recommend repeat growth at 4-6 weeks.  ? ?Otherwise I expect her pregnancy to be uncomplicated. ? ?All questions answered. ? ?I spent 30 minutes with > 50% in face to face consultation. ? ?Novella Olive, MD ?

## 2021-06-27 ENCOUNTER — Encounter: Payer: Self-pay | Admitting: *Deleted

## 2021-06-27 ENCOUNTER — Ambulatory Visit: Payer: Medicaid Other | Attending: Maternal & Fetal Medicine

## 2021-06-27 ENCOUNTER — Other Ambulatory Visit: Payer: Self-pay | Admitting: *Deleted

## 2021-06-27 ENCOUNTER — Ambulatory Visit: Payer: Medicaid Other | Admitting: *Deleted

## 2021-06-27 VITALS — BP 102/56 | HR 78

## 2021-06-27 DIAGNOSIS — Z87448 Personal history of other diseases of urinary system: Secondary | ICD-10-CM | POA: Insufficient documentation

## 2021-06-27 DIAGNOSIS — Z8759 Personal history of other complications of pregnancy, childbirth and the puerperium: Secondary | ICD-10-CM

## 2021-06-27 DIAGNOSIS — Z3A32 32 weeks gestation of pregnancy: Secondary | ICD-10-CM

## 2021-06-27 DIAGNOSIS — Z3689 Encounter for other specified antenatal screening: Secondary | ICD-10-CM

## 2021-06-27 DIAGNOSIS — O09293 Supervision of pregnancy with other poor reproductive or obstetric history, third trimester: Secondary | ICD-10-CM

## 2021-06-27 DIAGNOSIS — O352XX0 Maternal care for (suspected) hereditary disease in fetus, not applicable or unspecified: Secondary | ICD-10-CM | POA: Diagnosis not present

## 2021-06-27 DIAGNOSIS — Z362 Encounter for other antenatal screening follow-up: Secondary | ICD-10-CM | POA: Diagnosis present

## 2021-06-30 ENCOUNTER — Encounter (HOSPITAL_BASED_OUTPATIENT_CLINIC_OR_DEPARTMENT_OTHER): Payer: Self-pay | Admitting: Emergency Medicine

## 2021-06-30 ENCOUNTER — Other Ambulatory Visit: Payer: Self-pay

## 2021-06-30 ENCOUNTER — Emergency Department (HOSPITAL_BASED_OUTPATIENT_CLINIC_OR_DEPARTMENT_OTHER)
Admission: EM | Admit: 2021-06-30 | Discharge: 2021-07-01 | Disposition: A | Discharge status: Home / Self Care | Payer: Medicaid Other | Attending: Emergency Medicine | Admitting: Emergency Medicine

## 2021-06-30 DIAGNOSIS — T7840XA Allergy, unspecified, initial encounter: Secondary | ICD-10-CM

## 2021-06-30 DIAGNOSIS — L509 Urticaria, unspecified: Secondary | ICD-10-CM | POA: Insufficient documentation

## 2021-06-30 DIAGNOSIS — Z3A32 32 weeks gestation of pregnancy: Secondary | ICD-10-CM

## 2021-06-30 DIAGNOSIS — O99713 Diseases of the skin and subcutaneous tissue complicating pregnancy, third trimester: Secondary | ICD-10-CM | POA: Diagnosis present

## 2021-06-30 NOTE — ED Triage Notes (Addendum)
Pt reports allergic reaction to unknown x 1 hour. Cough, shob, rash, and chest tightness. Rash present on arms, neck, and pubic area. Pt is [redacted]WKs gestation. Denies pregnancy related issues. 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

## 2021-06-30 NOTE — ED Notes (Signed)
Patient has no laryngeal stridor noted, mallampati 1. BBS =, no wheezes, rales, rhonchi noted. RR 16.

## 2021-06-30 NOTE — ED Notes (Signed)
Pt hooked to toco monitor, FHR 120-130's. Maternal HR 70-80's.

## 2021-07-01 MED ORDER — PREDNISONE 10 MG PO TABS: 20.0000 mg | ORAL_TABLET | Freq: Two times a day (BID) | ORAL | 0 refills | Status: DC

## 2021-07-01 MED ORDER — PREDNISONE 20 MG PO TABS
20.0000 mg | ORAL_TABLET | Freq: Once | ORAL | Status: AC
  Administered 2021-07-01: 20 mg via ORAL
  Filled 2021-07-01: qty 1

## 2021-07-01 MED ORDER — DIPHENHYDRAMINE HCL 25 MG PO CAPS
25.0000 mg | ORAL_CAPSULE | Freq: Once | ORAL | Status: AC
  Administered 2021-07-01: 25 mg via ORAL
  Filled 2021-07-01: qty 1

## 2021-07-01 NOTE — ED Notes (Signed)
Pt cleared by [facility]

## 2021-07-01 NOTE — Progress Notes (Signed)
[facility] contacted regarding G3P2 presenting to [facility] at 32 wks with unknown allergic reaction. No obstetric complaints.  Pt endorses positive fetal movement, denies uc's, denies bleeding nor LOF.  Pt connected to EFM at this time.

## 2021-07-01 NOTE — ED Notes (Signed)
ED Provider at bedside. 

## 2021-07-01 NOTE — ED Notes (Signed)
Rapid response called back, will monitor pt for 20 minutes

## 2021-07-01 NOTE — Progress Notes (Signed)
Pt receives care with [facility]. Dr. Claiborne Billings covering call for practice.  Dr. Claiborne Billings notified at [phone] of reactive NST.  No obstetric complaints at this time.  Pt cleared obstetrically and to follow up with normal care.  [facility] RN notified at 0045, patient removed from EFM at this time.

## 2021-07-01 NOTE — ED Notes (Signed)
Contacted [facility] to notify to monitor pt

## 2021-07-01 NOTE — Discharge Instructions (Signed)
Begin taking prednisone as prescribed.  Take Benadryl 25 mg 3 times daily for the next 3 days.  Return to the emergency department if you develop difficulty breathing, throat swelling, or other new and concerning symptoms.

## 2021-07-01 NOTE — ED Provider Notes (Signed)
[facility] EMERGENCY DEPARTMENT Provider Note   CSN: [id number] Arrival date & time: 06/30/21  2243     History  Chief Complaint  Patient presents with   Allergic Reaction    Misty Allen is a 36 y.o. female.  Patient is a 36 year old female with no significant past medical history.  She is pregnant at approximately [redacted] weeks gestation.  Patient presenting with complaints of rash and itching.  This started several hours prior to presentation in the setting of no known exposures or contacts.  She has a history of bee sting allergy, but denies being bitten or stung.  She denies any new soaps, detergents, foods, medications, lotions, or other contacts.  Patient speaks Arabic and history taken with the assistance of her husband who is present at bedside.  The history is provided by the patient.       Home Medications Prior to Admission medications   Medication Sig Start Date End Date Taking? Authorizing Provider  EPINEPHrine 0.3 mg/0.3 mL IJ SOAJ injection Inject 0.3 mLs (0.3 mg total) into the muscle as needed for anaphylaxis. 07/06/19   Terald Sleeper, MD  Prenatal Vit-Fe Fumarate-FA (MULTIVITAMIN-PRENATAL) 27-0.8 MG TABS tablet Take 1 tablet by mouth daily at 12 noon.    [provider]      Allergies    Bee venom    Review of Systems   Review of Systems  All other systems reviewed and are negative.   Physical Exam Updated Vital Signs BP 108/76   Pulse 90   Temp 98 F (36.7 C)   Resp 20   LMP 12/09/2020   SpO2 98%  Physical Exam Vitals and nursing note reviewed.  Constitutional:      General: She is not in acute distress.    Appearance: She is well-developed. She is not diaphoretic.  HENT:     Head: Normocephalic and atraumatic.  Cardiovascular:     Rate and Rhythm: Normal rate and regular rhythm.     Heart sounds: No murmur heard.    No friction rub. No gallop.  Pulmonary:     Effort: Pulmonary effort is normal. No respiratory  distress.     Breath sounds: Normal breath sounds. No wheezing.  Abdominal:     General: Bowel sounds are normal. There is no distension.     Palpations: Abdomen is soft.     Tenderness: There is no abdominal tenderness.  Musculoskeletal:        General: Normal range of motion.     Cervical back: Normal range of motion and neck supple.  Skin:    General: Skin is warm and dry.     Comments: There is an urticarial rash noted to the neck, wrists, the insides of the thighs.  Neurological:     General: No focal deficit present.     Mental Status: She is alert and oriented to person, place, and time.    ED Results / Procedures / Treatments   Labs (all labs ordered are listed, but only abnormal results are displayed) Labs Reviewed - No data to display  EKG None  Radiology No results found.  Procedures Procedures    Medications Ordered in ED Medications  predniSONE (DELTASONE) tablet 20 mg (has no administration in time range)  diphenhydrAMINE (BENADRYL) capsule 25 mg (has no administration in time range)    ED Course/ Medical Decision Making/ A&P  Patient presenting with an allergic reaction.  She has urticaria noted to the wrist, thighs, and  neck.  Trigger is unknown as patient denies any new contacts or exposures.  Patient given prednisone and Benadryl and seems to be feeling better.  There is nothing to suggest anaphylaxis.  Patient is pregnant at [redacted] weeks gestation and was monitored with no issues noted.  Final Clinical Impression(s) / ED Diagnoses Final diagnoses:  None    Rx / DC Orders ED Discharge Orders     None         Geoffery Lyons, MD 07/01/21 [phone]

## 2021-07-25 ENCOUNTER — Ambulatory Visit: Payer: Medicaid Other | Admitting: *Deleted

## 2021-07-25 ENCOUNTER — Ambulatory Visit: Payer: Medicaid Other | Attending: Obstetrics

## 2021-07-25 VITALS — BP 104/68 | HR 78

## 2021-07-25 DIAGNOSIS — O352XX0 Maternal care for (suspected) hereditary disease in fetus, not applicable or unspecified: Secondary | ICD-10-CM | POA: Diagnosis not present

## 2021-07-25 DIAGNOSIS — O09293 Supervision of pregnancy with other poor reproductive or obstetric history, third trimester: Secondary | ICD-10-CM | POA: Insufficient documentation

## 2021-07-25 DIAGNOSIS — Z3A36 36 weeks gestation of pregnancy: Secondary | ICD-10-CM

## 2021-07-25 DIAGNOSIS — Z8759 Personal history of other complications of pregnancy, childbirth and the puerperium: Secondary | ICD-10-CM | POA: Insufficient documentation

## 2021-07-25 DIAGNOSIS — Z362 Encounter for other antenatal screening follow-up: Secondary | ICD-10-CM

## 2021-08-03 LAB — OB RESULTS CONSOLE GBS: GBS: POSITIVE

## 2021-08-08 ENCOUNTER — Telehealth (HOSPITAL_COMMUNITY): Payer: Self-pay | Admitting: *Deleted

## 2021-08-08 NOTE — Telephone Encounter (Signed)
Preadmission screen  

## 2021-08-14 NOTE — Telephone Encounter (Signed)
Interpreter number [phone]

## 2021-08-16 ENCOUNTER — Inpatient Hospital Stay (HOSPITAL_COMMUNITY)
Admission: AD | Admit: 2021-08-16 | Discharge: 2021-08-18 | DRG: 807 | Disposition: A | Discharge status: Home / Self Care | Payer: Medicaid Other | Attending: Obstetrics and Gynecology | Admitting: Obstetrics and Gynecology

## 2021-08-16 ENCOUNTER — Other Ambulatory Visit: Payer: Self-pay

## 2021-08-16 ENCOUNTER — Encounter (HOSPITAL_COMMUNITY): Payer: Self-pay | Admitting: Obstetrics and Gynecology

## 2021-08-16 DIAGNOSIS — O26893 Other specified pregnancy related conditions, third trimester: Secondary | ICD-10-CM | POA: Diagnosis present

## 2021-08-16 DIAGNOSIS — O4290 Premature rupture of membranes, unspecified as to length of time between rupture and onset of labor, unspecified weeks of gestation: Principal | ICD-10-CM | POA: Diagnosis present

## 2021-08-16 DIAGNOSIS — O4292 Full-term premature rupture of membranes, unspecified as to length of time between rupture and onset of labor: Principal | ICD-10-CM | POA: Diagnosis present

## 2021-08-16 DIAGNOSIS — O99824 Streptococcus B carrier state complicating childbirth: Secondary | ICD-10-CM | POA: Diagnosis present

## 2021-08-16 DIAGNOSIS — Z3A39 39 weeks gestation of pregnancy: Secondary | ICD-10-CM | POA: Diagnosis not present

## 2021-08-16 DIAGNOSIS — Z20822 Contact with and (suspected) exposure to covid-19: Secondary | ICD-10-CM | POA: Diagnosis present

## 2021-08-16 LAB — CBC
HCT: 34.9 % — ABNORMAL LOW (ref 36.0–46.0)
Hemoglobin: 12 g/dL (ref 12.0–15.0)
MCH: 29.9 pg (ref 26.0–34.0)
MCHC: 34.4 g/dL (ref 30.0–36.0)
MCV: 86.8 fL (ref 80.0–100.0)
Platelets: 211 10*3/uL (ref 150–400)
RBC: 4.02 MIL/uL (ref 3.87–5.11)
RDW: 13.2 % (ref 11.5–15.5)
WBC: 8.4 10*3/uL (ref 4.0–10.5)
nRBC: 0 % (ref 0.0–0.2)

## 2021-08-16 LAB — POCT FERN TEST: POCT Fern Test: POSITIVE

## 2021-08-16 LAB — RESP PANEL BY RT-PCR (FLU A&B, COVID) ARPGX2
Influenza A by PCR: NEGATIVE
Influenza B by PCR: NEGATIVE
SARS Coronavirus 2 by RT PCR: NEGATIVE

## 2021-08-16 LAB — RPR: RPR Ser Ql: NONREACTIVE

## 2021-08-16 LAB — TYPE AND SCREEN
ABO/RH(D): O POS
Antibody Screen: NEGATIVE

## 2021-08-16 MED ORDER — LACTATED RINGERS IV SOLN
INTRAVENOUS | Status: DC
Start: 2021-08-16 — End: 2021-08-16

## 2021-08-16 MED ORDER — COCONUT OIL OIL: 1.0000 | TOPICAL_OIL | Status: DC | PRN

## 2021-08-16 MED ORDER — GUAIFENESIN 100 MG/5ML PO LIQD: 5.0000 mL | ORAL | Status: DC | PRN

## 2021-08-16 MED ORDER — MENTHOL 3 MG MT LOZG: 1.0000 | LOZENGE | OROMUCOSAL | Status: DC | PRN

## 2021-08-16 MED ORDER — ZOLPIDEM TARTRATE 5 MG PO TABS: 5.0000 mg | ORAL_TABLET | Freq: Every evening | ORAL | Status: DC | PRN

## 2021-08-16 MED ORDER — OXYCODONE-ACETAMINOPHEN 5-325 MG PO TABS: 1.0000 | ORAL_TABLET | ORAL | Status: DC | PRN

## 2021-08-16 MED ORDER — LACTATED RINGERS IV SOLN
500.0000 mL | INTRAVENOUS | Status: DC | PRN
  Administered 2021-08-16: 250 mL via INTRAVENOUS

## 2021-08-16 MED ORDER — DIBUCAINE (PERIANAL) 1 % EX OINT: 1.0000 | TOPICAL_OINTMENT | CUTANEOUS | Status: DC | PRN

## 2021-08-16 MED ORDER — BENZOCAINE-MENTHOL 20-0.5 % EX AERO: 1.0000 | INHALATION_SPRAY | CUTANEOUS | Status: DC | PRN

## 2021-08-16 MED ORDER — LIDOCAINE HCL (PF) 1 % IJ SOLN: 30.0000 mL | INTRAMUSCULAR | Status: DC | PRN

## 2021-08-16 MED ORDER — SODIUM CHLORIDE 0.9 % IV SOLN
5.0000 10*6.[IU] | Freq: Once | INTRAVENOUS | Status: AC
Start: 2021-08-16 — End: 2021-08-16
  Administered 2021-08-16: 5 10*6.[IU] via INTRAVENOUS
  Filled 2021-08-16: qty 5

## 2021-08-16 MED ORDER — SOD CITRATE-CITRIC ACID 500-334 MG/5ML PO SOLN: 30.0000 mL | ORAL | Status: DC | PRN

## 2021-08-16 MED ORDER — ONDANSETRON HCL 4 MG/2ML IJ SOLN: 4.0000 mg | INTRAMUSCULAR | Status: DC | PRN

## 2021-08-16 MED ORDER — FENTANYL CITRATE (PF) 100 MCG/2ML IJ SOLN
50.0000 ug | INTRAMUSCULAR | Status: DC | PRN
  Administered 2021-08-16: 100 ug via INTRAVENOUS
  Filled 2021-08-16: qty 2

## 2021-08-16 MED ORDER — SIMETHICONE 80 MG PO CHEW: 80.0000 mg | CHEWABLE_TABLET | ORAL | Status: DC | PRN

## 2021-08-16 MED ORDER — ACETAMINOPHEN 325 MG PO TABS: 650.0000 mg | ORAL_TABLET | ORAL | Status: DC | PRN

## 2021-08-16 MED ORDER — OXYTOCIN-SODIUM CHLORIDE 30-0.9 UT/500ML-% IV SOLN: 2.5000 [IU]/h | INTRAVENOUS | Status: DC

## 2021-08-16 MED ORDER — OXYTOCIN BOLUS FROM INFUSION
333.0000 mL | Freq: Once | INTRAVENOUS | Status: AC
  Administered 2021-08-16: 333 mL via INTRAVENOUS

## 2021-08-16 MED ORDER — OXYCODONE-ACETAMINOPHEN 5-325 MG PO TABS: 2.0000 | ORAL_TABLET | ORAL | Status: DC | PRN

## 2021-08-16 MED ORDER — OXYTOCIN-SODIUM CHLORIDE 30-0.9 UT/500ML-% IV SOLN
1.0000 m[IU]/min | INTRAVENOUS | Status: DC
  Administered 2021-08-16: 2 m[IU]/min via INTRAVENOUS
  Filled 2021-08-16: qty 500

## 2021-08-16 MED ORDER — ONDANSETRON HCL 4 MG/2ML IJ SOLN
4.0000 mg | Freq: Four times a day (QID) | INTRAMUSCULAR | Status: DC | PRN
Start: 2021-08-16 — End: 2021-08-16

## 2021-08-16 MED ORDER — WITCH HAZEL-GLYCERIN EX PADS: 1.0000 | MEDICATED_PAD | CUTANEOUS | Status: DC | PRN

## 2021-08-16 MED ORDER — IBUPROFEN 600 MG PO TABS
600.0000 mg | ORAL_TABLET | Freq: Four times a day (QID) | ORAL | Status: DC
  Administered 2021-08-16 – 2021-08-18 (×5): 600 mg via ORAL
  Filled 2021-08-16 (×5): qty 1

## 2021-08-16 MED ORDER — PENICILLIN G POT IN DEXTROSE 60000 UNIT/ML IV SOLN
3.0000 10*6.[IU] | INTRAVENOUS | Status: DC
  Administered 2021-08-16: 3 10*6.[IU] via INTRAVENOUS
  Filled 2021-08-16: qty 50

## 2021-08-16 MED ORDER — ACETAMINOPHEN 325 MG PO TABS
650.0000 mg | ORAL_TABLET | ORAL | Status: DC | PRN
Start: 2021-08-16 — End: 2021-08-16

## 2021-08-16 MED ORDER — PRENATAL MULTIVITAMIN CH
1.0000 | ORAL_TABLET | Freq: Every day | ORAL | Status: DC
  Administered 2021-08-16 – 2021-08-18 (×3): 1 via ORAL
  Filled 2021-08-16 (×3): qty 1

## 2021-08-16 MED ORDER — SENNOSIDES-DOCUSATE SODIUM 8.6-50 MG PO TABS
2.0000 | ORAL_TABLET | Freq: Every day | ORAL | Status: DC
Start: 2021-08-17 — End: 2021-08-18
  Administered 2021-08-17 – 2021-08-18 (×2): 2 via ORAL
  Filled 2021-08-16 (×2): qty 2

## 2021-08-16 MED ORDER — DIPHENHYDRAMINE HCL 25 MG PO CAPS: 25.0000 mg | ORAL_CAPSULE | Freq: Four times a day (QID) | ORAL | Status: DC | PRN

## 2021-08-16 MED ORDER — TERBUTALINE SULFATE 1 MG/ML IJ SOLN: 0.2500 mg | Freq: Once | INTRAMUSCULAR | Status: DC | PRN

## 2021-08-16 MED ORDER — ONDANSETRON HCL 4 MG PO TABS: 4.0000 mg | ORAL_TABLET | ORAL | Status: DC | PRN

## 2021-08-16 MED ORDER — TETANUS-DIPHTH-ACELL PERTUSSIS 5-2.5-18.5 LF-MCG/0.5 IM SUSY: 0.5000 mL | PREFILLED_SYRINGE | Freq: Once | INTRAMUSCULAR | Status: DC

## 2021-08-16 NOTE — Lactation Note (Signed)
This note was copied from a baby's chart. Lactation Consultation Note  Patient Name: Boy Navpreet Szczygiel [id number] Date: 08/16/2021 Reason for consult: L&D Initial assessment Age:34 hours  P3, Ex BF.  Assisted with latching with ease. Lactation to follow up on MBU.   Maternal Data Does the patient have breastfeeding experience prior to this delivery?: Yes How long did the patient breastfeed?: 2 years with last child, did not breastfeed first child  Feeding Mother's Current Feeding Choice: Breast Milk  LATCH Score Latch: Grasps breast easily, tongue down, lips flanged, rhythmical sucking.  Audible Swallowing: A few with stimulation  Type of Nipple: Everted at rest and after stimulation  Comfort (Breast/Nipple): Soft / non-tender  Hold (Positioning): Assistance needed to correctly position infant at breast and maintain latch.  LATCH Score: 8  Interventions Interventions: Education;Assisted with latch;Skin to skin  Consult Status Consult Status: Follow-up from L&D   Dahlia Byes [facility] 08/16/2021, 12:23 PM

## 2021-08-16 NOTE — Lactation Note (Signed)
This note was copied from a baby's chart. Lactation Consultation Note  Patient Name: Misty Allen [id number] Date: 08/16/2021   Age:34 hours LC attempted to see mom but she was sleeping.  Maternal Data    Feeding    LATCH Score                    Lactation Tools Discussed/Used    Interventions    Discharge    Consult Status      Charyl Dancer 08/16/2021, 9:12 PM

## 2021-08-16 NOTE — Progress Notes (Signed)
Spoke with on-call Dr Jackelyn Knife regarding patient's desire for female only providers. Amenable to helping manage patient on labor floor. RN aware of change. Currently cat 1 tracing, TOCO q3-6m, pitocin at 40mU/min. Declining epidural, however anesthesia attending today is female BP 111/65   Pulse 65   Temp 98.2 F (36.8 C) (Oral)   Resp 16   Ht 5' (1.524 m)   Wt 73.7 kg   LMP 12/09/2020   SpO2 99%   BMI 31.74 kg/m  Anticipate SVD

## 2021-08-16 NOTE — MAU Note (Addendum)
.  Misty Allen is a 36 y.o. at [redacted]w[redacted]d here in MAU reporting: SROM @ 1200 yesterday with clear odorless fluid with some VB spotting with the initial gush. Pt reports the leaking continued and increased gushing. Pt reports DFM since her waterbroke.  Pt reports CTX infrequently, back pain as well.  Pt denies any recent intercourse, PIH s/s, and complications in the pregnancy.  Onset of complaint: 1200 yesterday  Pain score: 3/10 back Vitals:   08/16/21 0311  BP: 111/76  Pulse: 92  Resp: 18  Temp: 97.6 F (36.4 C)  SpO2: 100%     FHT:125 Lab orders placed from triage: none

## 2021-08-16 NOTE — H&P (Addendum)
Arabic speaking only, declines interpreter services, husband translating  Misty Allen is a 36 y.o. female G3P2002 [redacted]w[redacted]d presenting for ROM since 1200 yesterday. She reports occasional contractions, some bloody show. Normal FM.   Reports mild cough since arriving to hospital. Otherwise feeling fine with no fever.   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 This pregnancy has been followed with ultrasounds by MFM, most recently 7/11: AGA, EFW 2670 gm, 34%ile AFI 12.8, vtx  OB History     Gravida  3   Para  2   Term  2   Preterm  0   AB  0   Living  2      SAB  0   IAB  0   Ectopic  0   Multiple  0   Live Births  2          Past Medical History:  Diagnosis Date   Medical history non-contributory    Past Surgical History:  Procedure Laterality Date   NO PAST SURGERIES     Family History: family history includes Diabetes in her mother. Social History:  reports that she has never smoked. She uses smokeless tobacco. She reports that she does not drink alcohol and does not use drugs.     Maternal Diabetes: No Genetic Screening: Normal Maternal Ultrasounds/Referrals: Normal Fetal Ultrasounds or other Referrals:  Referred to Materal Fetal Medicine  Maternal Substance Abuse:  No Significant Maternal Medications:  None Significant Maternal Lab Results:  Group B Strep positive Other Comments:  None  Review of Systems Per HPI Exam Physical Exam  Dilation: 3 Effacement (%): 70 Station: -3 Exam by:: Santiago Bur, RN Blood pressure 113/68, pulse 81, temperature 98.2 F (36.8 C), temperature source Oral, resp. rate 20, height 5' (1.524 m), weight 73.7 kg, last menstrual period 12/09/2020, SpO2 100 %, currently breastfeeding. Gen: NAD, resting comfortably CVS: normal pulses Lungs: nonlabored respirations Abd: gravid Ext: no calf edema or tenderness   Fetal testing: 125bpm, mod variability, + accels,  no decels Toco: occasional ctx Prenatal labs: ABO, Rh:  --/--/O POS (08/02 0413) Antibody: NEG (08/02 0413) Rubella: Immune (02/13 0000) RPR: Nonreactive (02/13 0000)  HBsAg: Negative (02/13 0000)  HIV: Non-reactive (02/13 0000)  GBS: Positive/-- (07/20 0000)   Assessment/Plan: 36Y G3P2002 @ [redacted]w[redacted]d, PROM Fetal wellbeing: cat I tracing PROM since yesterday at 1200, few irregular contractions, recommend labor augmentation with pitocin, agrees GBS positive: penicillin Pain control: not planning for epidural Requesting female providers only. Discussed with patient that oncoming doctor at 0700 is female and I don't anticipate she will deliver prior to that. She declines to be cared for or have any males in the room except for her husband. Will discuss with oncoming Dr. Ashok Norris.  Cough: COVID test, supportive care  [name] 08/16/2021, 5:18 AM

## 2021-08-17 LAB — CBC
HCT: 35 % — ABNORMAL LOW (ref 36.0–46.0)
Hemoglobin: 11.9 g/dL — ABNORMAL LOW (ref 12.0–15.0)
MCH: 30 pg (ref 26.0–34.0)
MCHC: 34 g/dL (ref 30.0–36.0)
MCV: 88.2 fL (ref 80.0–100.0)
Platelets: 195 10*3/uL (ref 150–400)
RBC: 3.97 MIL/uL (ref 3.87–5.11)
RDW: 13.4 % (ref 11.5–15.5)
WBC: 7.9 10*3/uL (ref 4.0–10.5)
nRBC: 0 % (ref 0.0–0.2)

## 2021-08-17 NOTE — Lactation Note (Signed)
This note was copied from a baby's chart. Lactation Consultation Note  Patient Name: Misty Allen Date: 08/17/2021   Age:34 hours   LC Note:  Attempted to visit with family, however, all members are asleep at this time.  Will follow up later today.   Maternal Data    Feeding    LATCH Score                    Lactation Tools Discussed/Used    Interventions    Discharge    Consult Status      Kirstine Jacquin R Lamond Glantz 08/17/2021, 8:22 AM

## 2021-08-17 NOTE — Progress Notes (Signed)
Post Partum Day 1 Subjective: Exam performed with telephone arabic interpreter no complaints, up ad lib, voiding, and tolerating PO  Objective: Blood pressure 117/68, pulse 70, temperature 98.5 F (36.9 C), temperature source Oral, resp. rate 16, height 5' (1.524 m), weight 73.7 kg, last menstrual period 12/09/2020, SpO2 99 %, unknown if currently breastfeeding.  Physical Exam:  General: alert, cooperative, and appears stated age Lochia: appropriate Uterine Fundus: firm DVT Evaluation: No evidence of DVT seen on physical exam.  Recent Labs    08/16/21 0415 08/17/21 0517  HGB 12.0 11.9*  HCT 34.9* 35.0*    Assessment/Plan: Plan for discharge tomorrow Breastfeeding Desires neonatal circumcision, R/B/A of procedure discussed at length. Pt understands that neonatal circumcision is not considered medically necessary and is elective. The risks include, but are not limited to bleeding, infection, damage to the penis, development of scar tissue, and having to have it redone at a later date. Pt understands theses risks and wishes to proceed    LOS: 1 day   Waynard Reeds, MD 08/17/2021, 11:32 AM

## 2021-08-17 NOTE — Social Work (Signed)
CSW received consult for [place] 11.   CSW met with MOB to offer support and complete assessment.    Arabic Interpreter [phone]  CSW observed MOB STS with the infant. CSW offered to use the virtual interpreter and MOB was agreeable. CSW met with MOB at bedside and introduced CSW role. MOB presented calm and receptive to the visit. CSW explained the reason for the visit, to discuss the [place]. CSW inquired how MOB has felt since giving birth. MOB reported that she has been feeling good and felt her L&D was "wonderful." FOB and their small child returned. MOB gave CSW permission to continue with the assessment and share information with FOB present. CSW inquired how MOB felt during the pregnancy. MOB reported that she felt "good." CSW discussed the [place]. MOB expressed for the past seven days she has felt "a little tired" but since having the baby she "feels much better." MOB denied history of mental health. MOB denied thoughts of harm to self and others.   CSW provided education regarding the baby blues period vs. perinatal mood disorders, discussed treatment and gave resources for mental health follow up if concerns arise.  CSW recommended MOB completed self-evaluation during the postpartum time period using the New Mom Checklist from Postpartum Progress (Arabic) and encouraged MOB to contact a medical professional if symptoms are noted at any time. MOB reported understanding.   MOB reported that she has a bassinet for the infant, some diapers, and wipes. FOB politely interjected and stated that they will borrow a car seat from his son until they can get one from the "the [employer]." CSW assessed further. FOB reported [facility]" is an agency that provides free items for the baby, so he called to make an appointment but the infant arrived before their scheduled appointment. FOB reported that they have WIC/Food Stamps and Medicaid. CSW discussed additional community support and MOB gave CSE  permission to make the referral. CSW offered a car seat. FOB declined and stated they will borrow his sons until they get one.  CSW provided review of Sudden Infant Death Syndrome (SIDS) precautions. MOB reported understanding.   CSW identifies no further need for intervention and no barriers to discharge at this time.   Kathrin Greathouse, MSW, LCSW [facility] [place] Worker  [phone] 08/17/2021  5:02 PM

## 2021-08-17 NOTE — Lactation Note (Signed)
This note was copied from a baby's chart. Lactation Consultation Note  Patient Name: Misty Allen IONGE'X Date: 08/17/2021 Reason for consult: Initial assessment;Term Age:34 hours   P3: Term baby at 39+1 weeks Feeding preference: Breast and formula  Arabic interpreter, [place] (# [phone]) used for interpretation  Birth parent had baby "Misty Allen" swaddled and latched in the cradle hold when I arrived.  Observed him to be sleeping at the breast with a shallow latch.  Offered to assist with a better latch; birth parent receptive.  Unswaddled him and he awakened; latched easily and observed him feeding with stimulation for 15 minutes.  Birth parent noted strong uterine contractions.  She prefers to use the cradle hold and reminded her to keep him deep and well supported at the breast.  Birth parent has a tendency to let him slip down to a shallow latch.    Breast feeding basics reviewed.  Encouraged feeding 8-12 times/24 hours or sooner if baby shows cues.  Birth parent will call for assistance as needed.  Suggested she call her insurance company to obtain an electric pump.  Birth parent aware and has been trying to call.  Support person not currently present, however, when he is here, birth parent prefers to have him interpret.  RN updated.   Maternal Data Has patient been taught Hand Expression?: Yes Does the patient have breastfeeding experience prior to this delivery?: Yes How long did the patient breastfeed?: 3 days with her first child and 2 years with her second child  Feeding Mother's Current Feeding Choice: Breast Milk and Formula  LATCH Score Latch: Grasps breast easily, tongue down, lips flanged, rhythmical sucking.  Audible Swallowing: None  Type of Nipple: Everted at rest and after stimulation  Comfort (Breast/Nipple): Soft / non-tender  Hold (Positioning): Assistance needed to correctly position infant at breast and maintain latch.  LATCH Score: 7   Lactation Tools  Discussed/Used    Interventions Interventions: Breast feeding basics reviewed;Assisted with latch;Skin to skin;Breast massage;Hand express;Breast compression;Position options;Support pillows;Adjust position;Education;LC Services brochure  Discharge Pump: Advised to call insurance company (Mother plans to call insurance company for pump)  Consult Status Consult Status: Follow-up Date: 08/18/21 Follow-up type: In-patient    Hannibal Skalla R Maniya Donovan 08/17/2021, 10:52 AM

## 2021-08-18 ENCOUNTER — Inpatient Hospital Stay (HOSPITAL_COMMUNITY): Payer: Medicaid Other

## 2021-08-18 ENCOUNTER — Inpatient Hospital Stay (HOSPITAL_COMMUNITY)
Admission: AD | Admit: 2021-08-18 | Payer: Medicaid Other | Source: Home / Self Care | Admitting: Obstetrics and Gynecology

## 2021-08-18 MED ORDER — IBUPROFEN 600 MG PO TABS
600.0000 mg | ORAL_TABLET | Freq: Four times a day (QID) | ORAL | 0 refills | Status: AC
Start: 2021-08-18 — End: ?

## 2021-08-18 MED ORDER — ACETAMINOPHEN 325 MG PO TABS: 650.0000 mg | ORAL_TABLET | ORAL | 1 refills | Status: AC | PRN

## 2021-08-18 NOTE — Progress Notes (Signed)
Pt was educated with husband earlier in shift about co-sleeping with baby in bed. Baby safety paper was signed upon admission. Husband also signed paper to translate for mom. This nurse walked in to see baby in bed with mom again co-sleeping after education at beginning of shift.

## 2021-08-18 NOTE — Lactation Note (Signed)
This note was copied from a baby's chart. Lactation Consultation Note  Patient Name: Misty Allen OHYWV'P Date: 08/18/2021 Reason for consult: Follow-up assessment;Term;Infant weight loss Age:34 hours   P3: Term baby at 39+1 weeks Feeding preference:  Breast/formula Weight loss: 8%  Support person desires to be the interpreter for birth parent.  Spoke with pediatrician and this family will be staying another night to work on breast feeding.  Worked with birth parent extensively yesterday and reviewed all topics discussed yesterday; focused on reminding her to feed STS, latch deeply, keep blanket cover off baby while feeding, continue gentle stimulation to keep him actively engaged with his feeding, burp after feeding and do not use pacifier at this time.  Parents verbalized understanding.  Due to his weight loss I offered to initiate the electric breast pump.  Birth parent interested.  She does not wish to supplement with formula or donor milk.  Pump set up and #21 flanges are appropriate at this time.  Observed her pumping for 15 minutes and 15 mls obtained.  Praised her for her efforts.  Demonstrated paced bottle feeding using the yellow slow flow nipple but changed to the purple nipple for better ease of feeding.  "[name]" fed well and burped easily; content after feeding.  Encouraged birth parent to latch at least every three hours or sooner if baby shows cues.  She will pump immediately after feeding and give "[name]" her pumped milk.  Parents will call for any questions/concerns.  Stressed the importance of continuing this all day and throughout the night to help minimize further weight loss.  RN and pediatrician updated.  RN will follow through with obtaining a Stork pump for family.  Discussed options with them yesterday and they were hoping to get a pump through their insurance company but are having difficulty getting through to an insurance representative that can assist with  this.   Maternal Data Has patient been taught Hand Expression?: Yes Does the patient have breastfeeding experience prior to this delivery?: Yes How long did the patient breastfeed?: 3 days with her first child and 2 years with her second child  Feeding Mother's Current Feeding Choice: Breast Milk and Formula Nipple Type: Extra Slow Flow  LATCH Score                    Lactation Tools Discussed/Used Tools: Pump;Flanges Flange Size: 21 Breast pump type: Double-Electric Breast Pump;Manual Pump Education: Setup, frequency, and cleaning;Milk Storage Reason for Pumping: Supplementation for weight loss Pumping frequency: Every three hours Pumped volume: 15 mL  Interventions    Discharge Pump: Stork Pump (RN to initiate and obtain Stork pump)  Consult Status Consult Status: Follow-up Date: 08/19/21 Follow-up type: In-patient    Natausha Jungwirth R Camron Essman 08/18/2021, 11:36 AM

## 2021-08-18 NOTE — Discharge Summary (Signed)
Postpartum Discharge Summary       Patient Name: Misty Allen DOB: [birth year] MRN: [id number]  Date of admission: 08/16/2021 Delivery date:08/16/2021  Delivering provider: Carlisle Cater  Date of discharge: 08/18/2021  Admitting diagnosis: PROM (premature rupture of membranes) [O42.90] Intrauterine pregnancy: [redacted]w[redacted]d     Secondary diagnosis:  Principal Problem:   PROM (premature rupture of membranes)  Additional problems: GBS positive    Discharge diagnosis: Term Pregnancy Delivered                                              Post partum procedures: none Augmentation: Pitocin Complications: None  Hospital course: Onset of Labor With Vaginal Delivery      36 y.o. yo G3P3003 at [redacted]w[redacted]d was admitted in Latent Labor on 08/16/2021. Patient had an uncomplicated labor course as follows:  Membrane Rupture Time/Date: 12:00 PM ,08/15/2021   Delivery Method:Vaginal, Spontaneous  Episiotomy: None  Lacerations:  None  Patient had an uncomplicated postpartum course.  She is ambulating, tolerating a regular diet, passing flatus, and urinating well. Patient is discharged home in stable condition on 08/18/21.  Newborn Data: Birth date:08/16/2021  Birth time:11:32 AM  Gender:Female  Living status:Living  Apgars:8 ,9  Weight:2770 g     Physical exam  Vitals:   08/17/21 0230 08/17/21 1500 08/17/21 2033 08/18/21 0606  BP: 117/68 121/69 110/65 118/86  Pulse: 70 65 78 74  Resp: 16 18 17 16   Temp: 98.5 F (36.9 C) 98.5 F (36.9 C) 98.2 F (36.8 C) 98.5 F (36.9 C)  TempSrc: Oral Oral Oral Oral  SpO2: 99% 99% 100%   Weight:      Height:       General: alert and cooperative Lochia: appropriate Uterine Fundus: firm I Labs: Lab Results  Component Value Date   WBC 7.9 08/17/2021   HGB 11.9 (L) 08/17/2021   HCT 35.0 (L) 08/17/2021   MCV 88.2 08/17/2021   PLT 195 08/17/2021      Latest Ref Rng & Units 07/06/2019   12:07 PM  CMP  Glucose 70 - 99 mg/dL 07/08/2019   BUN 6 - 20 mg/dL 16    Creatinine 409 - 1.00 mg/dL 7.35   Sodium 3.29 - 924 mmol/L 139   Potassium 3.5 - 5.1 mmol/L 4.1   Chloride 98 - 111 mmol/L 106   CO2 22 - 32 mmol/L 23   Calcium 8.9 - 10.3 mg/dL 9.1    Edinburgh Score:    08/17/2021   10:20 AM  Edinburgh Postnatal Depression Scale Screening Tool  I have been able to laugh and see the funny side of things. 2  I have looked forward with enjoyment to things. 1  I have blamed myself unnecessarily when things went wrong. 0  I have been anxious or worried for no good reason. 0  I have felt scared or panicky for no good reason. 0  Things have been getting on top of me. 2  I have been so unhappy that I have had difficulty sleeping. 3  I have felt sad or miserable. 0  I have been so unhappy that I have been crying. 3  The thought of harming myself has occurred to me. 0  Edinburgh Postnatal Depression Scale Total 11     After visit meds:  Allergies as of 08/18/2021  Reactions   Bee Venom Shortness Of Breath, Swelling   SOB        Medication List     TAKE these medications    acetaminophen 325 MG tablet Commonly known as: Tylenol Take 2 tablets (650 mg total) by mouth every 4 (four) hours as needed (for pain scale < 4).   EPINEPHrine 0.3 mg/0.3 mL Soaj injection Commonly known as: EPI-PEN Inject 0.3 mLs (0.3 mg total) into the muscle as needed for anaphylaxis.   ibuprofen 600 MG tablet Commonly known as: ADVIL Take 1 tablet (600 mg total) by mouth every 6 (six) hours.   multivitamin-prenatal 27-0.8 MG Tabs tablet Take 1 tablet by mouth daily at 12 noon.         Discharge home in stable condition Infant Feeding: Breast Infant Disposition:home with mother Discharge instruction: per After Visit Summary and Postpartum booklet. Activity: Advance as tolerated. Pelvic rest for 6 weeks.  Diet: routine diet Future Appointments:No future appointments. Follow up Visit:  Follow-up Information     Shivaji, Valerie Roys, MD. Schedule an  appointment as soon as possible for a visit in 6 week(s).   Specialty: Obstetrics and Gynecology Why: Routine postpartum appointment in 6 weeks with Dr. Reina Fuse or Dannielle Burn information: [address] [place] [address] [place] [address] [phone]                  Please schedule this patient for a In person postpartum visit in 6 weeks with the following provider: MD.  Delivery mode:  Vaginal, Spontaneous  Anticipated Birth Control:  Unsure   08/18/2021 Oliver Pila, MD

## 2021-08-18 NOTE — Progress Notes (Signed)
   Post Partum Day 2 Subjective: up ad lib, voiding, and tolerating PO  Husband translating for mother and she reports feeling well.  No major c/o.  Worried about milk supply as not in yet fully.  Baby feeding well.  Objective: Blood pressure 118/86, pulse 74, temperature 98.5 F (36.9 C), temperature source Oral, resp. rate 16, height 5' (1.524 m), weight 73.7 kg, last menstrual period 12/09/2020, SpO2 100 %, unknown if currently breastfeeding.  Physical Exam:  General: alert and cooperative Lochia: appropriate Uterine Fundus: firm   Recent Labs    08/16/21 0415 08/17/21 0517  HGB 12.0 11.9*  HCT 34.9* 35.0*    Assessment/Plan: Discharge home [place] pp f/u in 6 weeks   LOS: 2 days   Oliver Pila, MD 08/18/2021, 9:11 AM

## 2021-08-22 ENCOUNTER — Inpatient Hospital Stay (HOSPITAL_COMMUNITY): Payer: Medicaid Other

## 2021-08-26 ENCOUNTER — Telehealth (HOSPITAL_COMMUNITY): Payer: Self-pay

## 2021-08-26 NOTE — Telephone Encounter (Signed)
No answer. No voicemail option available.  Marcelino Duster [facility]  08/26/21,1702

## 2021-10-19 ENCOUNTER — Ambulatory Visit: Payer: Medicaid Other | Admitting: Podiatry

## 2022-02-22 ENCOUNTER — Emergency Department (HOSPITAL_COMMUNITY)
Admission: EM | Admit: 2022-02-22 | Discharge: 2022-02-22 | Disposition: A | Discharge status: Home / Self Care | Payer: Self-pay | Attending: Emergency Medicine | Admitting: Emergency Medicine

## 2022-02-22 ENCOUNTER — Other Ambulatory Visit: Payer: Self-pay

## 2022-02-22 ENCOUNTER — Encounter (HOSPITAL_COMMUNITY): Payer: Self-pay

## 2022-02-22 ENCOUNTER — Emergency Department (HOSPITAL_COMMUNITY): Payer: Self-pay

## 2022-02-22 DIAGNOSIS — T59811A Toxic effect of smoke, accidental (unintentional), initial encounter: Secondary | ICD-10-CM | POA: Insufficient documentation

## 2022-02-22 DIAGNOSIS — R079 Chest pain, unspecified: Secondary | ICD-10-CM | POA: Insufficient documentation

## 2022-02-22 DIAGNOSIS — R0602 Shortness of breath: Secondary | ICD-10-CM | POA: Insufficient documentation

## 2022-02-22 MED ADMIN — Albuterol Sulfate Soln Nebu 0.083% (2.5 MG/3ML): 5 mg | RESPIRATORY_TRACT | NDC 00378827055

## 2022-02-22 MED FILL — Albuterol Sulfate Soln Nebu 0.083% (2.5 MG/3ML): 5.0000 mg | RESPIRATORY_TRACT | Qty: 6 | Status: AC
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
## 2022-10-10 ENCOUNTER — Ambulatory Visit: Payer: Medicaid Other | Admitting: Obstetrics and Gynecology

## 2022-10-10 ENCOUNTER — Other Ambulatory Visit: Payer: Self-pay

## 2022-10-10 ENCOUNTER — Encounter: Payer: Self-pay | Admitting: Obstetrics and Gynecology

## 2022-10-10 VITALS — BP 106/71 | HR 70 | Wt 145.5 lb

## 2022-10-10 DIAGNOSIS — Z3049 Encounter for surveillance of other contraceptives: Secondary | ICD-10-CM | POA: Diagnosis not present

## 2022-10-10 DIAGNOSIS — Z603 Acculturation difficulty: Secondary | ICD-10-CM

## 2022-10-10 LAB — POCT PREGNANCY, URINE: Preg Test, Ur: NEGATIVE

## 2022-10-10 NOTE — Progress Notes (Signed)
GYNECOLOGY VISIT  Patient name: Misty Allen MRN [id number]  Date of birth: [birth year] Chief Complaint:   Contraception  History:  Misty Allen is a 37 y.o. [phone] being seen today for possible IUD insertion. Has had at least 2 episodes of unprotected intercourse in the last week, with most recent being 2 days ago.     LMP 8/17 possible LMP  Past Medical History:  Diagnosis Date   Medical history non-contributory     Past Surgical History:  Procedure Laterality Date   NO PAST SURGERIES      The following portions of the patient's history were reviewed and updated as appropriate: allergies, current medications, past family history, past medical history, past social history, past surgical history and problem list.   Health Maintenance:   Last pap     Component Value Date/Time   DIAGPAP  11/24/2020 0836    - Negative for intraepithelial lesion or malignancy (NILM)   DIAGPAP  09/17/2017 0000    NEGATIVE FOR INTRAEPITHELIAL LESIONS OR MALIGNANCY.   HPVHIGH Negative 11/24/2020 0836   ADEQPAP  11/24/2020 0836    Satisfactory for evaluation; transformation zone component PRESENT.   ADEQPAP  09/17/2017 0000    Satisfactory for evaluation  endocervical/transformation zone component PRESENT.   Last mammogram: n/a   Review of Systems:  Pertinent items are noted in HPI. Comprehensive review of systems was otherwise negative.   Objective:  Physical Exam BP 106/71   Pulse 70   Wt 145 lb 8.1 oz (66 kg)   Breastfeeding Yes   BMI 28.42 kg/m    Physical Exam Vitals and nursing note reviewed.  Constitutional:      Appearance: Normal appearance.  HENT:     Head: Normocephalic and atraumatic.  Pulmonary:     Effort: Pulmonary effort is normal.  Skin:    General: Skin is warm and dry.  Neurological:     General: No focal deficit present.     Mental Status: She is alert.  Psychiatric:        Mood and Affect: Mood normal.        Behavior: Behavior normal.         Thought Content: Thought content normal.        Judgment: Judgment normal.       Assessment & Plan:   1. Encounter for surveillance of other contraceptive Discussed various methods for contraception. Would prefer paragard IUD. Discussed can be used as EC but given multiple instances of unprotected IC since LMP, can't be sure not early pregnancy. Will use condoms for now and return in 2 weeks for insertion.   2. Language barrier, cultural differences In person interpreter   Routine preventative health maintenance measures emphasized.  Lorriane Shire, MD [facility], [facility]

## 2022-10-31 ENCOUNTER — Ambulatory Visit: Payer: Medicaid Other | Admitting: Obstetrics and Gynecology

## 2023-06-29 ENCOUNTER — Encounter (HOSPITAL_BASED_OUTPATIENT_CLINIC_OR_DEPARTMENT_OTHER): Payer: Self-pay

## 2023-06-29 ENCOUNTER — Emergency Department (HOSPITAL_BASED_OUTPATIENT_CLINIC_OR_DEPARTMENT_OTHER): Payer: Self-pay

## 2023-06-29 ENCOUNTER — Emergency Department (HOSPITAL_BASED_OUTPATIENT_CLINIC_OR_DEPARTMENT_OTHER)
Admission: EM | Admit: 2023-06-29 | Discharge: 2023-06-29 | Disposition: A | Discharge status: Home / Self Care | Payer: Self-pay | Attending: Emergency Medicine | Admitting: Emergency Medicine

## 2023-06-29 ENCOUNTER — Other Ambulatory Visit: Payer: Self-pay

## 2023-06-29 DIAGNOSIS — K209 Esophagitis, unspecified without bleeding: Secondary | ICD-10-CM | POA: Insufficient documentation

## 2023-06-29 DIAGNOSIS — R079 Chest pain, unspecified: Secondary | ICD-10-CM

## 2023-06-29 LAB — BASIC METABOLIC PANEL WITH GFR
Anion gap: 9 (ref 5–15)
BUN: 13 mg/dL (ref 6–20)
CO2: 25 mmol/L (ref 22–32)
Calcium: 8.7 mg/dL — ABNORMAL LOW (ref 8.9–10.3)
Chloride: 105 mmol/L (ref 98–111)
Creatinine, Ser: 0.65 mg/dL (ref 0.44–1.00)
GFR, Estimated: >60 mL/min
Glucose, Bld: 85 mg/dL (ref 70–99)
Potassium: 3.9 mmol/L (ref 3.5–5.1)
Sodium: 138 mmol/L (ref 135–145)

## 2023-06-29 LAB — CBC
HCT: 35.5 % — ABNORMAL LOW (ref 36.0–46.0)
Hemoglobin: 11.8 g/dL — ABNORMAL LOW (ref 12.0–15.0)
MCH: 27.9 pg (ref 26.0–34.0)
MCHC: 33.2 g/dL (ref 30.0–36.0)
MCV: 83.9 fL (ref 80.0–100.0)
Platelets: 228 10*3/uL (ref 150–400)
RBC: 4.23 MIL/uL (ref 3.87–5.11)
RDW: 13.5 % (ref 11.5–15.5)
WBC: 5.9 10*3/uL (ref 4.0–10.5)
nRBC: 0 % (ref 0.0–0.2)

## 2023-06-29 LAB — TROPONIN T, HIGH SENSITIVITY: Troponin T High Sensitivity: <15 ng/L

## 2023-06-29 LAB — PREGNANCY, URINE: Preg Test, Ur: NEGATIVE

## 2023-06-29 MED ADMIN — Alum & Mag Hydroxide-Simethicone Susp 200-200-20 MG/5ML: 30 mL | ORAL | NDC 63739015910

## 2023-06-29 MED FILL — Alum & Mag Hydroxide-Simethicone Susp 200-200-20 MG/5ML: 30.0000 mL | ORAL | Qty: 30 | Status: AC

## 2023-06-29 NOTE — ED Provider Notes (Signed)
 [place] [facility] AT [facility] Provider Note   CSN: [id number] Arrival date & time: 06/29/23  7829     Patient presents with: Chest Pain   Misty Allen is a 38 y.o. female.    Chest Pain Patient speaks Arabic and translator technology was used.  Patient has 2 days of chest pain.  Anterior chest.  Worse with certain movements.  Worse with deep breaths.  Goes to the back.  Also worse with eating.  Even drinking water makes the pain worse.  Does not feel short of breath but does have some pain with breathing.  No recent travel.  No swelling in her legs.  States she has had pain like this before and just diagnosed with shortness of breath.     Prior to Admission medications   Medication Sig Start Date End Date Taking? Authorizing Provider  omeprazole (PRILOSEC) 20 MG capsule Take 1 capsule (20 mg total) by mouth daily. 06/29/23  Yes Mozell Arias, MD    Allergies: Patient has no known allergies.    Review of Systems  Cardiovascular:  Positive for chest pain.    Updated Vital Signs BP 107/78 (BP Location: Right Arm)   Pulse 72   Temp 98.1 F (36.7 C) (Oral)   Resp 16   Ht 4' 11.84 (1.52 m)   Wt 61 kg   SpO2 100%   BMI 26.40 kg/m   Physical Exam Vitals and nursing note reviewed.   Cardiovascular:     Rate and Rhythm: Regular rhythm.  Pulmonary:     Breath sounds: No decreased breath sounds or wheezing.  Chest:     Chest wall: Tenderness present.     Comments: Tenderness to anterior chest wall in the sternal area. Abdominal:     Tenderness: There is no abdominal tenderness.   Musculoskeletal:     Cervical back: Neck supple.     Right lower leg: No edema.     Left lower leg: No edema.   Neurological:     Mental Status: She is alert.     (all labs ordered are listed, but only abnormal results are displayed) Labs Reviewed  BASIC METABOLIC PANEL WITH GFR - Abnormal; Notable for the following components:      Result Value    Calcium 8.7 (*)    All other components within normal limits  CBC - Abnormal; Notable for the following components:   Hemoglobin 11.8 (*)    HCT 35.5 (*)    All other components within normal limits  PREGNANCY, URINE  TROPONIN T, HIGH SENSITIVITY    EKG: EKG Interpretation Date/Time:  Saturday June 29 2023 07:43:27 EDT Ventricular Rate:  77 PR Interval:  131 QRS Duration:  85 QT Interval:  366 QTC Calculation: 415 R Axis:   51  Text Interpretation: Sinus arrhythmia Borderline low voltage, extremity leads Confirmed by Mozell Arias [phone]) on 06/29/2023 7:48:06 AM  Radiology: [facility] Chest 2 View Result Date: 06/29/2023 EXAM: 2 VIEW(S) XRAY OF THE CHEST 06/29/2023 08:14:00 AM COMPARISON: 2-view chest x-ray 02/22/2022 CLINICAL HISTORY: Chest pain radiating to back. C/o constant right sided sharp chest \\T \ back pain x 2 days with shortness of breath. Pain worse with movement or heavy lifting. FINDINGS: LUNGS AND PLEURA: No focal pulmonary opacity. No pulmonary edema. No pleural effusion. No pneumothorax. HEART AND MEDIASTINUM: No acute abnormality of the cardiac and mediastinal silhouettes. BONES AND SOFT TISSUES: No acute osseous abnormality. IMPRESSION: 1. No acute cardiopulmonary pathology. Electronically signed by: Audree Leas  MD 06/29/2023 08:34 AM EDT RP Workstation: [id number]     Procedures   Medications Ordered in the ED - No data to display                                  Medical Decision Making Amount and/or Complexity of Data Reviewed Labs: ordered. Radiology: ordered.  Risk Prescription drug management.   Patient with chest pain.  Anterior chest.  Differential diagnosis along with includes causes such as esophagitis, coronary artery disease, pneumonia.  Pulmonary embolism felt less likely.  Will get x-ray.  EKG reassuring.  Also get troponin.  Workup reassuring.  X-ray reassuring negative troponin.  Will discharge home.  I think most likely esophagitis.   Will start PPI.  Will follow-up PCP.  May need GI evaluation.     Final diagnoses:  Nonspecific chest pain  Esophagitis    ED Discharge Orders          Ordered    omeprazole (PRILOSEC) 20 MG capsule  Daily        06/29/23 0917               Mozell Arias, MD 06/29/23 [phone]

## 2023-06-29 NOTE — ED Triage Notes (Signed)
 C/o constant right sided sharp chest & back pain x 2 days with shortness of breath. Pain worse with movement or heavy lifting.   Arabic interpreter used during triage [place] [phone]

## 2024-02-08 DIAGNOSIS — R0789 Other chest pain: Secondary | ICD-10-CM | POA: Insufficient documentation

## 2024-02-08 HISTORY — DX: Unspecified asthma, uncomplicated: J45.909

## 2024-02-08 MED ADMIN — Iohexol IV Soln 350 MG/ML: 75 mL | INTRAVENOUS | NDC 00407141491

## 2024-02-08 MED ADMIN — Sodium Chloride IV Soln 0.9%: 500 mL | INTRAVENOUS | NDC 00264580210

## 2024-02-08 NOTE — ED Provider Notes (Signed)
 " [place] [facility] AT [facility] Provider Note   CSN: [id number] Arrival date & time: 02/08/24  1405     Patient presents with: Chest Pain   Misty Allen is a 39 y.o. female who presents emergency department for right sided chest pain has been constant for about 2 weeks.  She sometimes feels palpitations and she feels short of breath and seems to feel worse when she is doing a lot of work around the house.  She denies loss of consciousness, hemoptysis, unilateral leg swelling, LMP was January night.  Patient also reports that she has been having weight loss.  Her husband reports that she has been under increased stress.  She denies any vomiting or nausea.  Professional translation services are utilized     Chest Pain      Prior to Admission medications  Medication Sig Start Date End Date Taking? Authorizing Provider  omeprazole  (PRILOSEC) 20 MG capsule Take 1 capsule (20 mg total) by mouth daily. 06/29/23   Patsey Lot, MD    Allergies: Patient has no known allergies.    Review of Systems  Cardiovascular:  Positive for chest pain.    Updated Vital Signs BP 100/67 (BP Location: Right Arm)   Pulse 65   Temp 97.8 F (36.6 C) (Oral)   Resp 16   Ht 5' (1.524 m)   Wt 54.9 kg   SpO2 100%   BMI 23.64 kg/m   Physical Exam Vitals and nursing note reviewed.  Constitutional:      General: She is not in acute distress.    Appearance: She is well-developed. She is not diaphoretic.  HENT:     Head: Normocephalic and atraumatic.     Right Ear: External ear normal.     Left Ear: External ear normal.     Nose: Nose normal.     Mouth/Throat:     Mouth: Mucous membranes are moist.  Eyes:     General: No scleral icterus.    Conjunctiva/sclera: Conjunctivae normal.  Cardiovascular:     Rate and Rhythm: Normal rate and regular rhythm.     Heart sounds: Normal heart sounds. No murmur heard.    No friction rub. No gallop.  Pulmonary:     Effort:  Pulmonary effort is normal. No respiratory distress.     Breath sounds: Normal breath sounds.  Abdominal:     General: Bowel sounds are normal. There is no distension.     Palpations: Abdomen is soft. There is no mass.     Tenderness: There is no abdominal tenderness. There is no guarding.  Musculoskeletal:     Cervical back: Normal range of motion.     Right lower leg: No edema.     Left lower leg: No edema.  Skin:    General: Skin is warm and dry.  Neurological:     Mental Status: She is alert and oriented to person, place, and time.  Psychiatric:        Behavior: Behavior normal.     (all labs ordered are listed, but only abnormal results are displayed) Labs Reviewed  BASIC METABOLIC PANEL WITH GFR - Abnormal; Notable for the following components:      Result Value   Calcium 8.6 (*)    All other components within normal limits  CBC - Abnormal; Notable for the following components:   Hemoglobin 11.5 (*)    HCT 34.0 (*)    All other components within normal limits  HCG, SERUM,  QUALITATIVE  D-DIMER, QUANTITATIVE  LIPASE, BLOOD  HEPATIC FUNCTION PANEL  TROPONIN T, HIGH SENSITIVITY    EKG: None  Radiology: No results found.   Procedures   Medications Ordered in the ED  sodium chloride  0.9 % bolus 500 mL (has no administration in time range)                                    Medical Decision Making Given the large differential diagnosis for Misty Allen, the decision making in this case is of high complexity.  After evaluating all of the data points in this case, the presentation of Misty Allen is NOT consistent with Acute Coronary Syndrome (ACS) and/or myocardial ischemia, pulmonary embolism, aortic dissection; Borhaave's, significant arrythmia, pneumothorax, cardiac tamponade, or other emergent cardiopulmonary condition.  Further, the presentation of Misty Allen is NOT consistent with pericarditis, myocarditis, cholecystitis, pancreatitis,  mediastinitis, endocarditis, new valvular disease.  Additionally, the presentation of Misty Allen NOT consistent with flail chest, cardiac contusion, ARDS, or significant intra-thoracic or intra-abdominal bleeding.  Moreover, this presentation is NOT consistent with pneumonia, sepsis, or pyelonephritis.  The patient has a  HEART score of 0 and CTA chest is negative   Strict return and follow-up precautions have been given by me personally or by detailed written instruction given verbally by nursing staff using the teach back method to the patient/family/caregiver(s).  Data Reviewed/Counseling: I have reviewed the patient's vital signs, nursing notes, and other relevant tests/information. I had a detailed discussion regarding the historical points, exam findings, and any diagnostic results supporting the discharge diagnosis. I also discussed the need for outpatient follow-up and the need to return to the ED if symptoms worsen or if there are any questions or concerns that arise at home.    Amount and/or Complexity of Data Reviewed Labs: ordered.    Details: I interpreted labs Mild anemia of insignificant value.  Troponin within normal limits, hCG negative, dimer elevated but negative CT angiogram hepatic functional with lower protein level Radiology: ordered. ECG/medicine tests: ordered and independent interpretation performed.    Details: Normal sinus rhythm  Risk Prescription drug management.        Final diagnoses:  None    ED Discharge Orders     None          Arloa Chroman, PA-C 02/09/24 1613    Darra Fonda MATSU, MD 02/14/24 [phone]  "

## 2024-02-10 ENCOUNTER — Encounter: Payer: Self-pay | Admitting: Obstetrics and Gynecology
